# Patient Record
Sex: Female | Born: 1980 | Race: Asian | Hispanic: No | Marital: Married | Smoking: Never smoker
Health system: Southern US, Community
[De-identification: ages and names within clinical notes are randomized; demographics above are authoritative.]

## PROBLEM LIST (undated history)

## (undated) ENCOUNTER — Inpatient Hospital Stay: Payer: Self-pay

## (undated) DIAGNOSIS — Z789 Other specified health status: Secondary | ICD-10-CM

## (undated) HISTORY — PX: NO PAST SURGERIES: SHX2092

---

## 2017-02-15 NOTE — L&D Delivery Note (Signed)
Delivery Note Primary OB: [facility] Delivery Provider: Marcelyn BruinsJacelyn Fabricio Endsley, CNM Gestational Age: Full term Antepartum complications: intrauterine growth restriction and prolonged rupture of membranes Intrapartum complications: PROM  A viable female was delivered via vertex presentation, LOA. A loose nuchal cord was easily reduced. Delivery of the shoulders and body followed without difficulty. The infant was placed on the maternal abdomen. The umbilical cord was doubly clamped and cut following delayed cord clamping. Cord blood was collected. The placenta was delivered spontaneously and was inspected and found to be intact with a three vessel cord. The cervix and vagina were inspected. A second degree perineal laceration was repaired with 2-0 and 3-0 Vicryl. The fundus was firm with massage, and Cytotec was given PR for clots and bleeding. Following the administration of this medication, the fundus was firm and bleeding was within normal limits. Patient and infant were bonding in stable condition. All counts were correct.  Apgars:9, 9  Weight:  5 lb 14.5 oz.   Placenta status: spontaneous and intact.  Cord: 3 vessels;  with the following complications: nuchal.  Anesthesia:  local Episiotomy:  none Lacerations:  2nd Suture Repair: 2.0 Vicryl,  3.0 Vicryl Est. Blood Loss (mL):  1481  Mom to postpartum.  Baby to Couplet care / Skin to Skin.  Marcelyn BruinsJacelyn Jazara Swiney, CNM [facility], [facility] 09/13/2017  3:32 AM

## 2017-07-07 ENCOUNTER — Other Ambulatory Visit: Payer: Self-pay | Admitting: Physician Assistant

## 2017-07-07 DIAGNOSIS — Z3482 Encounter for supervision of other normal pregnancy, second trimester: Secondary | ICD-10-CM

## 2017-07-07 LAB — OB RESULTS CONSOLE HIV ANTIBODY (ROUTINE TESTING): HIV: NONREACTIVE

## 2017-07-08 LAB — OB RESULTS CONSOLE GC/CHLAMYDIA
CHLAMYDIA, DNA PROBE: NEGATIVE
GC PROBE AMP, GENITAL: NEGATIVE

## 2017-07-08 LAB — OB RESULTS CONSOLE HEPATITIS B SURFACE ANTIGEN: Hepatitis B Surface Ag: NEGATIVE

## 2017-07-08 LAB — OB RESULTS CONSOLE VARICELLA ZOSTER ANTIBODY, IGG: VARICELLA IGG: IMMUNE

## 2017-07-08 LAB — OB RESULTS CONSOLE RUBELLA ANTIBODY, IGM: RUBELLA: IMMUNE

## 2017-07-08 LAB — OB RESULTS CONSOLE RPR: RPR: NONREACTIVE

## 2017-07-12 ENCOUNTER — Ambulatory Visit
Admission: RE | Admit: 2017-07-12 | Discharge: 2017-07-12 | Disposition: A | Discharge status: Home / Self Care | Payer: Self-pay | Source: Ambulatory Visit | Attending: Physician Assistant | Admitting: Physician Assistant

## 2017-07-12 DIAGNOSIS — Z3482 Encounter for supervision of other normal pregnancy, second trimester: Secondary | ICD-10-CM | POA: Insufficient documentation

## 2017-07-25 ENCOUNTER — Other Ambulatory Visit: Payer: Self-pay | Admitting: Advanced Practice Midwife

## 2017-07-25 DIAGNOSIS — O0930 Supervision of pregnancy with insufficient antenatal care, unspecified trimester: Secondary | ICD-10-CM

## 2017-07-25 DIAGNOSIS — O09523 Supervision of elderly multigravida, third trimester: Secondary | ICD-10-CM

## 2017-08-08 ENCOUNTER — Other Ambulatory Visit: Payer: Self-pay | Admitting: *Deleted

## 2017-08-08 ENCOUNTER — Ambulatory Visit: Payer: Self-pay

## 2017-08-08 ENCOUNTER — Ambulatory Visit
Admission: RE | Admit: 2017-08-08 | Discharge: 2017-08-08 | Disposition: A | Discharge status: Home / Self Care | Payer: Self-pay | Source: Ambulatory Visit | Attending: Obstetrics & Gynecology | Admitting: Obstetrics & Gynecology

## 2017-08-08 DIAGNOSIS — O365930 Maternal care for other known or suspected poor fetal growth, third trimester, not applicable or unspecified: Secondary | ICD-10-CM | POA: Insufficient documentation

## 2017-08-08 DIAGNOSIS — O0930 Supervision of pregnancy with insufficient antenatal care, unspecified trimester: Secondary | ICD-10-CM

## 2017-08-08 DIAGNOSIS — O09523 Supervision of elderly multigravida, third trimester: Secondary | ICD-10-CM | POA: Insufficient documentation

## 2017-08-08 DIAGNOSIS — O0933 Supervision of pregnancy with insufficient antenatal care, third trimester: Secondary | ICD-10-CM | POA: Insufficient documentation

## 2017-08-11 ENCOUNTER — Other Ambulatory Visit: Payer: Self-pay | Admitting: *Deleted

## 2017-08-11 ENCOUNTER — Ambulatory Visit
Admission: RE | Admit: 2017-08-11 | Discharge: 2017-08-11 | Disposition: A | Discharge status: Home / Self Care | Payer: Self-pay | Source: Ambulatory Visit | Attending: Maternal & Fetal Medicine | Admitting: Maternal & Fetal Medicine

## 2017-08-11 DIAGNOSIS — O365930 Maternal care for other known or suspected poor fetal growth, third trimester, not applicable or unspecified: Secondary | ICD-10-CM

## 2017-08-15 ENCOUNTER — Ambulatory Visit
Admission: RE | Admit: 2017-08-15 | Discharge: 2017-08-15 | Disposition: A | Discharge status: Home / Self Care | Payer: Self-pay | Source: Ambulatory Visit | Attending: Obstetrics and Gynecology | Admitting: Obstetrics and Gynecology

## 2017-08-15 ENCOUNTER — Ambulatory Visit (HOSPITAL_BASED_OUTPATIENT_CLINIC_OR_DEPARTMENT_OTHER)
Admission: RE | Admit: 2017-08-15 | Discharge: 2017-08-15 | Disposition: A | Discharge status: Home / Self Care | Payer: Self-pay | Source: Ambulatory Visit | Attending: Obstetrics and Gynecology | Admitting: Obstetrics and Gynecology

## 2017-08-15 ENCOUNTER — Other Ambulatory Visit: Payer: Self-pay | Admitting: *Deleted

## 2017-08-15 ENCOUNTER — Other Ambulatory Visit: Payer: Self-pay

## 2017-08-15 DIAGNOSIS — O365930 Maternal care for other known or suspected poor fetal growth, third trimester, not applicable or unspecified: Secondary | ICD-10-CM | POA: Insufficient documentation

## 2017-08-15 DIAGNOSIS — Z3A34 34 weeks gestation of pregnancy: Secondary | ICD-10-CM | POA: Insufficient documentation

## 2017-08-15 DIAGNOSIS — O365990 Maternal care for other known or suspected poor fetal growth, unspecified trimester, not applicable or unspecified: Secondary | ICD-10-CM

## 2017-08-15 NOTE — Progress Notes (Signed)
NST for FGR at 5866w2d:  FHR baseline 120's Moderate variability 15x15 accels (2) No decels Rare contractions on toco  Reactive NST

## 2017-08-18 ENCOUNTER — Observation Stay
Admission: RE | Admit: 2017-08-18 | Discharge: 2017-08-18 | Disposition: A | Discharge status: Home / Self Care | Payer: Self-pay | Attending: Obstetrics & Gynecology | Admitting: Obstetrics & Gynecology

## 2017-08-18 ENCOUNTER — Other Ambulatory Visit: Payer: Self-pay

## 2017-08-18 DIAGNOSIS — Z3A34 34 weeks gestation of pregnancy: Secondary | ICD-10-CM | POA: Insufficient documentation

## 2017-08-18 DIAGNOSIS — O365990 Maternal care for other known or suspected poor fetal growth, unspecified trimester, not applicable or unspecified: Secondary | ICD-10-CM | POA: Diagnosis present

## 2017-08-18 DIAGNOSIS — O365930 Maternal care for other known or suspected poor fetal growth, third trimester, not applicable or unspecified: Secondary | ICD-10-CM

## 2017-08-18 HISTORY — DX: Other specified health status: Z78.9

## 2017-08-18 MED ORDER — ONDANSETRON HCL 4 MG/2ML IJ SOLN: 4.0000 mg | Freq: Four times a day (QID) | INTRAMUSCULAR | Status: DC | PRN

## 2017-08-18 MED ORDER — ACETAMINOPHEN 325 MG PO TABS: 650.0000 mg | ORAL_TABLET | ORAL | Status: DC | PRN

## 2017-08-18 NOTE — Final Progress Note (Signed)
Physician Final Progress Note  Patient ID: Cristobal GoldmannHien T Grudzien MRN: [id number] DOB/AGE: 35/02/1980 35 y.o.  Admit date: 08/18/2017 Admitting provider: Nadara Mustardobert P Keshana Klemz, MD Discharge date: 08/18/2017  Admission Diagnoses: 34 weeks, IUGR  Discharge Diagnoses:  Active Problems:   IUGR (intrauterine growth restriction) affecting care of mother  Consults: None  Significant Findings/ Diagnostic Studies: Antepartum Fetal Monitoring for IUGR Pt reports good FM, no s/sx PTL  Procedures: A NST procedure was performed with FHR monitoring and a normal baseline established, appropriate time of 20-40 minutes of evaluation, and accels >2 seen w 15x15 characteristics.  Results show a REACTIVE NST.   Discharge Condition: good  Disposition: Discharge disposition: 01-Home or Self Care       Diet: Regular diet  Discharge Activity: Activity as tolerated  Discharge Instructions    Call MD for:   Complete by:  As directed    Worsening contractions or pain; leakage of fluid; bleeding.   Diet general   Complete by:  As directed    Increase activity slowly   Complete by:  As directed      Allergies as of 08/18/2017   No Known Allergies     Medication List    TAKE these medications   prenatal multivitamin Tabs tablet Take 1 tablet by mouth daily at 12 noon.      Total time spent taking care of this patient: TRIAGE and NST Interpretation  Signed: Letitia LibraRobert Paul Nuha Degner 08/18/2017, 10:22 AM

## 2017-08-18 NOTE — OB Triage Note (Signed)
Pt is a G3P2 at 1929w5d that presents for a scheduled NST due to IUGR. Pt denies VB, LOF and states Positive FM. Pt has interpreter present.

## 2017-08-18 NOTE — Discharge Summary (Signed)
  See FPN  NST for IUGR  Annamarie MajorPaul Harris, MD, Merlinda FrederickFACOG [facility], [facility] 08/18/2017  10:22 AM

## 2017-08-22 ENCOUNTER — Other Ambulatory Visit: Payer: Self-pay

## 2017-08-22 ENCOUNTER — Ambulatory Visit
Admission: RE | Admit: 2017-08-22 | Discharge: 2017-08-22 | Disposition: A | Discharge status: Home / Self Care | Payer: Self-pay | Source: Ambulatory Visit | Attending: Maternal & Fetal Medicine | Admitting: Maternal & Fetal Medicine

## 2017-08-22 DIAGNOSIS — O365930 Maternal care for other known or suspected poor fetal growth, third trimester, not applicable or unspecified: Secondary | ICD-10-CM | POA: Insufficient documentation

## 2017-08-22 DIAGNOSIS — Z3A35 35 weeks gestation of pregnancy: Secondary | ICD-10-CM | POA: Insufficient documentation

## 2017-08-22 NOTE — ED Notes (Signed)
Kim the Interpreter is present with pt for visit.

## 2017-08-25 ENCOUNTER — Ambulatory Visit
Admission: RE | Admit: 2017-08-25 | Discharge: 2017-08-25 | Disposition: A | Discharge status: Home / Self Care | Payer: Self-pay | Source: Ambulatory Visit | Attending: Maternal & Fetal Medicine | Admitting: Maternal & Fetal Medicine

## 2017-08-25 DIAGNOSIS — O365930 Maternal care for other known or suspected poor fetal growth, third trimester, not applicable or unspecified: Secondary | ICD-10-CM

## 2017-08-25 NOTE — ED Notes (Signed)
Interpeteur Dominic PeaPhuong #[id number] used for office visit and NST today

## 2017-08-29 ENCOUNTER — Ambulatory Visit
Admission: RE | Admit: 2017-08-29 | Discharge: 2017-08-29 | Disposition: A | Discharge status: Home / Self Care | Payer: Self-pay | Source: Ambulatory Visit | Attending: Obstetrics & Gynecology | Admitting: Obstetrics & Gynecology

## 2017-08-29 DIAGNOSIS — Z362 Encounter for other antenatal screening follow-up: Secondary | ICD-10-CM | POA: Insufficient documentation

## 2017-08-29 DIAGNOSIS — O26843 Uterine size-date discrepancy, third trimester: Secondary | ICD-10-CM | POA: Insufficient documentation

## 2017-08-29 DIAGNOSIS — O365930 Maternal care for other known or suspected poor fetal growth, third trimester, not applicable or unspecified: Secondary | ICD-10-CM | POA: Insufficient documentation

## 2017-08-29 DIAGNOSIS — Z3A36 36 weeks gestation of pregnancy: Secondary | ICD-10-CM | POA: Insufficient documentation

## 2017-08-29 DIAGNOSIS — O09523 Supervision of elderly multigravida, third trimester: Secondary | ICD-10-CM | POA: Insufficient documentation

## 2017-09-01 ENCOUNTER — Other Ambulatory Visit: Payer: Self-pay | Admitting: *Deleted

## 2017-09-01 ENCOUNTER — Ambulatory Visit
Admission: RE | Admit: 2017-09-01 | Discharge: 2017-09-01 | Disposition: A | Discharge status: Home / Self Care | Payer: Self-pay | Source: Ambulatory Visit | Attending: Maternal & Fetal Medicine | Admitting: Maternal & Fetal Medicine

## 2017-09-01 DIAGNOSIS — O365930 Maternal care for other known or suspected poor fetal growth, third trimester, not applicable or unspecified: Secondary | ICD-10-CM

## 2017-09-05 ENCOUNTER — Ambulatory Visit
Admission: RE | Admit: 2017-09-05 | Discharge: 2017-09-05 | Disposition: A | Discharge status: Home / Self Care | Payer: Self-pay | Source: Ambulatory Visit | Attending: Maternal & Fetal Medicine | Admitting: Maternal & Fetal Medicine

## 2017-09-05 DIAGNOSIS — O365930 Maternal care for other known or suspected poor fetal growth, third trimester, not applicable or unspecified: Secondary | ICD-10-CM | POA: Insufficient documentation

## 2017-09-05 DIAGNOSIS — Z3A37 37 weeks gestation of pregnancy: Secondary | ICD-10-CM | POA: Insufficient documentation

## 2017-09-07 LAB — OB RESULTS CONSOLE GBS: GBS: NEGATIVE

## 2017-09-08 ENCOUNTER — Ambulatory Visit
Admission: RE | Admit: 2017-09-08 | Discharge: 2017-09-08 | Disposition: A | Discharge status: Home / Self Care | Payer: Self-pay | Source: Ambulatory Visit | Attending: Obstetrics and Gynecology | Admitting: Obstetrics and Gynecology

## 2017-09-08 ENCOUNTER — Other Ambulatory Visit: Payer: Self-pay | Admitting: *Deleted

## 2017-09-08 DIAGNOSIS — O365931 Maternal care for other known or suspected poor fetal growth, third trimester, fetus 1: Secondary | ICD-10-CM

## 2017-09-08 DIAGNOSIS — O365930 Maternal care for other known or suspected poor fetal growth, third trimester, not applicable or unspecified: Secondary | ICD-10-CM

## 2017-09-08 NOTE — Progress Notes (Signed)
NST reactive   Baseline 120s  Moderate variability  Pos accels , no decels  With irregular UCs -see nursing note Via interpreter  I reviewed the findings of FGR <3rd percentile on last scan  I reviewed that this places the fetus at higher risk of stillbirth and so we recommend induction of labor at 37 -39 weeks.  Pt is concerned about a cesarean with induction - I reviewed the induction process and our goal of another vaginal delivery . Pt very much prefers expectant management and will await spontaneous labor  I told her that we will do nothing without her permission and given NST is reactive I did not insist she go to L&D  I told her if she has any concerns she can go to l&D directly- reviewed specifically decreased fetal movement , bleeding , ruptured membranes or regular UCs  .  We can discuss induction further when she returns on Monday   Jimmey RalphLivingston, Kim Hoffman

## 2017-09-12 ENCOUNTER — Other Ambulatory Visit: Payer: Self-pay

## 2017-09-12 ENCOUNTER — Inpatient Hospital Stay
Admission: RE | Admit: 2017-09-12 | Discharge: 2017-09-12 | Disposition: A | Discharge status: Home / Self Care | Payer: Self-pay | Source: Ambulatory Visit

## 2017-09-12 ENCOUNTER — Inpatient Hospital Stay
Admission: EM | Admit: 2017-09-12 | Discharge: 2017-09-14 | DRG: 806 | Disposition: A | Discharge status: Home / Self Care | Payer: Self-pay | Attending: Obstetrics & Gynecology | Admitting: Obstetrics & Gynecology

## 2017-09-12 DIAGNOSIS — O9081 Anemia of the puerperium: Secondary | ICD-10-CM | POA: Diagnosis not present

## 2017-09-12 DIAGNOSIS — O4202 Full-term premature rupture of membranes, onset of labor within 24 hours of rupture: Secondary | ICD-10-CM

## 2017-09-12 DIAGNOSIS — O365930 Maternal care for other known or suspected poor fetal growth, third trimester, not applicable or unspecified: Secondary | ICD-10-CM

## 2017-09-12 DIAGNOSIS — O4292 Full-term premature rupture of membranes, unspecified as to length of time between rupture and onset of labor: Principal | ICD-10-CM | POA: Diagnosis present

## 2017-09-12 DIAGNOSIS — O4290 Premature rupture of membranes, unspecified as to length of time between rupture and onset of labor, unspecified weeks of gestation: Secondary | ICD-10-CM | POA: Diagnosis present

## 2017-09-12 DIAGNOSIS — D62 Acute posthemorrhagic anemia: Secondary | ICD-10-CM | POA: Diagnosis not present

## 2017-09-12 DIAGNOSIS — Z3A38 38 weeks gestation of pregnancy: Secondary | ICD-10-CM

## 2017-09-12 LAB — TYPE AND SCREEN
ABO/RH(D): O POS
Antibody Screen: NEGATIVE

## 2017-09-12 LAB — CBC
HEMATOCRIT: 41.3 % (ref 35.0–47.0)
Hemoglobin: 13.6 g/dL (ref 12.0–16.0)
MCH: 31.2 pg (ref 26.0–34.0)
MCHC: 33 g/dL (ref 32.0–36.0)
MCV: 94.7 fL (ref 80.0–100.0)
Platelets: 212 10*3/uL (ref 150–440)
RBC: 4.36 MIL/uL (ref 3.80–5.20)
RDW: 16 % — ABNORMAL HIGH (ref 11.5–14.5)
WBC: 9.3 10*3/uL (ref 3.6–11.0)

## 2017-09-12 MED ORDER — TERBUTALINE SULFATE 1 MG/ML IJ SOLN: 0.2500 mg | Freq: Once | INTRAMUSCULAR | Status: DC | PRN

## 2017-09-12 MED ORDER — LACTATED RINGERS IV SOLN: 500.0000 mL | INTRAVENOUS | Status: DC | PRN

## 2017-09-12 MED ORDER — SODIUM CHLORIDE 0.9 % IV SOLN
INTRAVENOUS | Status: AC
  Filled 2017-09-12: qty 2000

## 2017-09-12 MED ORDER — OXYTOCIN 10 UNIT/ML IJ SOLN
INTRAMUSCULAR | Status: AC
  Filled 2017-09-12: qty 2

## 2017-09-12 MED ORDER — FENTANYL CITRATE (PF) 100 MCG/2ML IJ SOLN
50.0000 ug | INTRAMUSCULAR | Status: DC | PRN
  Administered 2017-09-13 (×2): 100 ug via INTRAVENOUS
  Filled 2017-09-12 (×2): qty 2

## 2017-09-12 MED ORDER — OXYTOCIN 40 UNITS IN LACTATED RINGERS INFUSION - SIMPLE MED: 1.0000 m[IU]/min | INTRAVENOUS | Status: DC

## 2017-09-12 MED ORDER — ACETAMINOPHEN 325 MG PO TABS: 650.0000 mg | ORAL_TABLET | ORAL | Status: DC | PRN

## 2017-09-12 MED ORDER — LACTATED RINGERS IV SOLN
INTRAVENOUS | Status: DC
  Administered 2017-09-12 (×2): via INTRAVENOUS

## 2017-09-12 MED ORDER — ONDANSETRON HCL 4 MG/2ML IJ SOLN: 4.0000 mg | Freq: Four times a day (QID) | INTRAMUSCULAR | Status: DC | PRN

## 2017-09-12 MED ORDER — LIDOCAINE HCL (PF) 1 % IJ SOLN
INTRAMUSCULAR | Status: AC
  Filled 2017-09-12: qty 30

## 2017-09-12 MED ORDER — OXYTOCIN 40 UNITS IN LACTATED RINGERS INFUSION - SIMPLE MED
1.0000 m[IU]/min | INTRAVENOUS | Status: DC
  Administered 2017-09-12: 1 m[IU]/min via INTRAVENOUS
  Filled 2017-09-12: qty 1000

## 2017-09-12 MED ORDER — OXYTOCIN 40 UNITS IN LACTATED RINGERS INFUSION - SIMPLE MED
2.5000 [IU]/h | INTRAVENOUS | Status: DC
  Administered 2017-09-13 (×2): 2.5 [IU]/h via INTRAVENOUS
  Filled 2017-09-12 (×2): qty 1000

## 2017-09-12 MED ORDER — OXYTOCIN BOLUS FROM INFUSION
500.0000 mL | Freq: Once | INTRAVENOUS | Status: AC
  Administered 2017-09-13: 500 mL via INTRAVENOUS

## 2017-09-12 MED ORDER — MISOPROSTOL 200 MCG PO TABS
ORAL_TABLET | ORAL | Status: AC
  Administered 2017-09-13: 800 ug
  Filled 2017-09-12: qty 4

## 2017-09-12 MED ORDER — SODIUM CHLORIDE 0.9 % IV SOLN
1.0000 g | INTRAVENOUS | Status: DC
  Administered 2017-09-12: 1 g via INTRAVENOUS
  Filled 2017-09-12 (×3): qty 1000

## 2017-09-12 MED ORDER — SODIUM CHLORIDE 0.9 % IV SOLN
2.0000 g | Freq: Once | INTRAVENOUS | Status: AC
  Administered 2017-09-12: 2 g via INTRAVENOUS

## 2017-09-12 MED ORDER — AMMONIA AROMATIC IN INHA
RESPIRATORY_TRACT | Status: AC
  Filled 2017-09-12: qty 10

## 2017-09-12 NOTE — OB Triage Note (Signed)
Pt states she noticed leaking in a small trickle of watery discharge after using bathroom, and when she changes position.

## 2017-09-12 NOTE — Progress Notes (Signed)
  Labor Progress Note   37 y.o. [id number] @ 2276w2d , admitted for  Pregnancy, Labor Management. PROM, IOL.  Subjective:  Calm, coping well with contractions, rates pain 6/10. Does not desire any pain medication at this time.  Objective:  BP 116/65   Pulse 79   Temp 98 F (36.7 C)   Resp 16   Ht 5\' 1"  (1.549 m)   Wt 144 lb (65.3 kg)   LMP 12/18/2016   SpO2 99%   BMI 27.21 kg/m  Abd: mild to moderate with contractions Extr: trace to 1+ bilateral pedal edema SVE: 3/60/-2  EFM: FHR: 120 bpm, variability: moderate,  accelerations:  Present,  decelerations:  Present occasional variable Toco: Frequency: Every 3-4 minutes, Duration: 60-80 seconds and Intensity: mild Labs: I have reviewed the patient's lab results.   Assessment & Plan:  [id number] @ 4976w2d, admitted for  Pregnancy and Labor/Delivery Management  1. Pain management: none, confirmed with [place] interpreter that this is still her choice for now. She will notify us if she desires any medication or an epidural. 2. FWB: reassuring, occasional variable with good return to baseline.  3. ID: GBS negative, starting ampicillin now for prolonged ROM, patient aware. 4. Labor management: Continue Pitocin induction. Consider IUPC with next exam.  All discussed with patient with assistance of [place] interpreter; see orders.  Marcelyn BruinsJacelyn Adrien Shankar, CNM 09/12/2017  6:06 PM

## 2017-09-12 NOTE — H&P (Signed)
Obstetrics Admission History & Physical     HPI:  37 y.o. [id number] @ 3841w2d (09/24/2017, by Last Menstrual Period). Admitted on 09/12/2017:   Patient Active Problem List   Diagnosis Date Noted  . PROM (premature rupture of membranes) 09/12/2017  . IUGR (intrauterine growth restriction) affecting care of mother 08/18/2017  . Poor fetal growth affecting management of mother 08/15/2017     Presents for possible fluid leaking at home beginning yesterday at around 1700. No vaginal bleeding. She is not feeling any contractions. Good fetal movement.  Positive on exam for PROM.   Prenatal care at: at [facility]. Pregnancy complicated by IUGR.  ROS: A review of systems was performed and negative, except as stated in the above HPI.  PMHx:  Past Medical History:  Diagnosis Date  . Medical history non-contributory    PSHx:  Past Surgical History:  Procedure Laterality Date  . NO PAST SURGERIES     Medications:  Medications Prior to Admission  Medication Sig Dispense Refill Last Dose  . Prenatal Vit-Fe Fumarate-FA (PRENATAL MULTIVITAMIN) TABS tablet Take 1 tablet by mouth daily at 12 noon.   09/11/2017 at Unknown time   Allergies: has No Known Allergies. OBHx:  OB History  Gravida Para Term Preterm AB Living  3 2 2     2   SAB TAB Ectopic Multiple Live Births          2    # Outcome Date GA Lbr Len/2nd Weight Sex Delivery Anes PTL Lv  3 Current           2 Term 08/16/12    F Vag-Spont None  LIV  1 Term 10/10/10    M Vag-Spont None  LIV   AVW:UJWJXBJY/[id number]:Negative/unremarkable except as detailed in HPI.Marland Kitchen.  No family history of birth defects. Soc Hx: Never smoker, Alcohol: none, Recreational drug use: none and Denies domestic abuse  Objective:   Vitals:   09/12/17 1017  BP: 113/65  Pulse: 90  Resp: 16  Temp: 98.1 F (36.7 C)  SpO2: 99%   Constitutional: Well nourished, well developed female in no acute distress.  HEENT: normal Skin: Warm and dry.  Cardiovascular: Regular rate and rhythm.    Extremity: trace to 1+ bilateral pedal edema Respiratory: Clear to auscultation bilaterally. Normal respiratory effort Abdomen: gravid, non-tender Neuro: DTRs 2+, Cranial nerves grossly intact Psych: Alert and Oriented x3. No memory deficits. Normal mood and affect.  MS: normal gait, normal bilateral lower extremity ROM/strength/stability.  Pelvic exam: is not limited by body habitus External Genitalia, Bartholin's glands, Urethra, Skene's glands: within normal limits Vagina: within normal limits and with no blood in the vault. Cervix: 2 cm/50/-3 Sterile speculum exam showed small amount of fluid present, positive ferning on microscopy Uterus: Uterus demonstrates irritability pattern.  Adnexa: not evaluated  EFM: FHR:130 bpm, variability: moderate, accelerations: Present,  decelerations: Absent Toco: Occasional irritability   Perinatal info:  Blood type: O positive Rubella - Immune Varicella - Immune TDaP -  Given in [place] during this pregnancy (2019) RPR NR / HIV Neg/ HBsAg Neg   Assessment & Plan:   37 y.o. [id number] @ 6941w2d, Admitted on 09/12/2017: for Induction of labor due to PROM; IUGR.  Admit for labor, Observe for cervical change, Fetal Wellbeing Reassuring, Epidural when ready and GBS status negative, treat as needed. Start Pitocin induction.  Marcelyn BruinsJacelyn Deshia Vanderhoof, CNM [facility], [facility] 09/12/2017  11:18 AM

## 2017-09-12 NOTE — Progress Notes (Signed)
AD request. Staff report patient may note understand AD due to cultural and language differences. Unit Staff agreed that once friends return to hospital staff will connect with chaplains to assist with AD needs.

## 2017-09-12 NOTE — Progress Notes (Signed)
1944 - pt sitting upright in bed, talking with family via phone. Monitors changed to wireless to improve ease of pt movement and ambulating when needed.

## 2017-09-12 NOTE — OB Triage Note (Signed)
Pt presents c/o possible LOF since yesterday. Nitrazine negative. Denies any ctx, bleeding, or pain. Reports positive fetal movement. Vitals WNL. Will continue to monitor.

## 2017-09-13 ENCOUNTER — Encounter: Payer: Self-pay | Admitting: *Deleted

## 2017-09-13 DIAGNOSIS — Z3A38 38 weeks gestation of pregnancy: Secondary | ICD-10-CM

## 2017-09-13 DIAGNOSIS — O365930 Maternal care for other known or suspected poor fetal growth, third trimester, not applicable or unspecified: Secondary | ICD-10-CM

## 2017-09-13 DIAGNOSIS — O4202 Full-term premature rupture of membranes, onset of labor within 24 hours of rupture: Secondary | ICD-10-CM

## 2017-09-13 LAB — CBC
HCT: 30 % — ABNORMAL LOW (ref 35.0–47.0)
Hemoglobin: 10.3 g/dL — ABNORMAL LOW (ref 12.0–16.0)
MCH: 31 pg (ref 26.0–34.0)
MCHC: 34.2 g/dL (ref 32.0–36.0)
MCV: 90.5 fL (ref 80.0–100.0)
PLATELETS: 198 10*3/uL (ref 150–440)
RBC: 3.32 MIL/uL — ABNORMAL LOW (ref 3.80–5.20)
RDW: 15.3 % — AB (ref 11.5–14.5)
WBC: 17.6 10*3/uL — ABNORMAL HIGH (ref 3.6–11.0)

## 2017-09-13 LAB — RPR: RPR Ser Ql: NONREACTIVE

## 2017-09-13 MED ORDER — OXYCODONE-ACETAMINOPHEN 5-325 MG PO TABS: 2.0000 | ORAL_TABLET | ORAL | Status: DC | PRN

## 2017-09-13 MED ORDER — IBUPROFEN 600 MG PO TABS
600.0000 mg | ORAL_TABLET | Freq: Four times a day (QID) | ORAL | Status: DC
  Administered 2017-09-13: 600 mg via ORAL

## 2017-09-13 MED ORDER — WITCH HAZEL-GLYCERIN EX PADS: 1.0000 "application " | MEDICATED_PAD | CUTANEOUS | Status: DC | PRN

## 2017-09-13 MED ORDER — ONDANSETRON HCL 4 MG/2ML IJ SOLN: 4.0000 mg | INTRAMUSCULAR | Status: DC | PRN

## 2017-09-13 MED ORDER — DIPHENHYDRAMINE HCL 25 MG PO CAPS: 25.0000 mg | ORAL_CAPSULE | Freq: Four times a day (QID) | ORAL | Status: DC | PRN

## 2017-09-13 MED ORDER — BENZOCAINE-MENTHOL 20-0.5 % EX AERO: 1.0000 "application " | INHALATION_SPRAY | CUTANEOUS | Status: DC | PRN

## 2017-09-13 MED ORDER — LIDOCAINE HCL (PF) 1 % IJ SOLN
30.0000 mL | Freq: Once | INTRAMUSCULAR | Status: AC
  Administered 2017-09-13: 30 mL

## 2017-09-13 MED ORDER — IBUPROFEN 600 MG PO TABS
600.0000 mg | ORAL_TABLET | Freq: Four times a day (QID) | ORAL | Status: DC
  Administered 2017-09-13 – 2017-09-14 (×5): 600 mg via ORAL
  Filled 2017-09-13 (×5): qty 1

## 2017-09-13 MED ORDER — ONDANSETRON HCL 4 MG PO TABS: 4.0000 mg | ORAL_TABLET | ORAL | Status: DC | PRN

## 2017-09-13 MED ORDER — SIMETHICONE 80 MG PO CHEW: 80.0000 mg | CHEWABLE_TABLET | ORAL | Status: DC | PRN

## 2017-09-13 MED ORDER — COCONUT OIL OIL
1.0000 "application " | TOPICAL_OIL | Status: DC | PRN
  Administered 2017-09-14: 1 via TOPICAL
  Filled 2017-09-13: qty 120

## 2017-09-13 MED ORDER — PRENATAL MULTIVITAMIN CH
1.0000 | ORAL_TABLET | Freq: Every day | ORAL | Status: DC
  Administered 2017-09-13 – 2017-09-14 (×2): 1 via ORAL
  Filled 2017-09-13 (×2): qty 1

## 2017-09-13 MED ORDER — SENNOSIDES-DOCUSATE SODIUM 8.6-50 MG PO TABS
2.0000 | ORAL_TABLET | ORAL | Status: DC
  Administered 2017-09-14: 2 via ORAL
  Filled 2017-09-13: qty 2

## 2017-09-13 MED ORDER — ACETAMINOPHEN 325 MG PO TABS: 650.0000 mg | ORAL_TABLET | ORAL | Status: DC | PRN

## 2017-09-13 MED ORDER — IBUPROFEN 600 MG PO TABS
ORAL_TABLET | ORAL | Status: AC
  Filled 2017-09-13: qty 1

## 2017-09-13 MED ORDER — DIBUCAINE 1 % RE OINT: 1.0000 "application " | TOPICAL_OINTMENT | RECTAL | Status: DC | PRN

## 2017-09-13 MED ORDER — OXYCODONE-ACETAMINOPHEN 5-325 MG PO TABS: 1.0000 | ORAL_TABLET | ORAL | Status: DC | PRN

## 2017-09-13 NOTE — Lactation Note (Addendum)
This note was copied from a baby's chart. Lactation Consultation Note  Patient Name: Kim Benjaman PottHien Funchess AOZHY'QToday's Date: 09/13/2017 Reason for consult: Follow-up assessment   Maternal Data    Feeding Feeding Type: Bottle Fed - Formula Nipple Type: Regular  LATCH Score Latch: Too sleepy or reluctant, no latch achieved, no sucking elicited.  Audible Swallowing: None  Type of Nipple: Everted at rest and after stimulation  Comfort (Breast/Nipple): Soft / non-tender  Hold (Positioning): Assistance needed to correctly position infant at breast and maintain latch.  LATCH Score: 5  Interventions Interventions: DEBP  Lactation Tools Discussed/Used     Consult Status Consult Status: Follow-up Date: 09/13/17 Follow-up type: In-patient  LC (myself along with Rosie FateSandra Shields [facility]) assisted mother with latch and positioning of infant. LCs attempted multiple times to latch infant and each time infant was very sleepy and spitting up clear mucous on some occasions. [employer] mobile interpreter used to communicate with patient. LC discussed pumping with mother and demonstrated the use of the pump. Mother pumped for 15 minutes and did not pump out any colostrum. Hand expression yielded a few drops of colostrum which was fed to infant. Infant became very fussy and showing hunger cues so LC discussed with mother the option of feeding infant donor milk or formula until infant is more alert to breastfeed. Mother stated that she would like to feed infant formula. Nurse encouraged mother to continue pumping to stimulate colostrum production.   [name] 09/13/2017, 4:50 PM

## 2017-09-13 NOTE — Plan of Care (Signed)
PPH with QBL of 1481 at delivery. Cytotec and second bag of pit given. Difficulty voiding. May have to In and out cath. 2nd degree tear with repair.

## 2017-09-13 NOTE — Discharge Summary (Signed)
OB Discharge Summary     Patient Name: Kim GoldmannHien T Boyte DOB: 02/05/1981 MRN: [id number]  Date of admission: 09/12/2017 Delivering Provider: Oswaldo ConroyJacelyn Y Schmid, CNM  Date of Delivery: 09/13/2017  Date of discharge: 09/14/2017 Admitting diagnosis: PROM Intrauterine pregnancy: 3758w3d     Secondary diagnosis: IUGR     Discharge diagnosis: Term Pregnancy Delivered, Post partum hemorrhage . Inadequate labor, Prolonged rupture of membranes, acute blood loss anemia                        Hospital course:  Induction of Labor With Vaginal Delivery   37 y.o. yo G3P2002 at 5658w3d was admitted to the hospital 09/12/2017 for induction of labor.  Indication for induction: PROM and IUGR.  Patient had an uncomplicated labor course as follows: Membrane Rupture Time/Date:   ,09/11/2017   Intrapartum Procedures: Episiotomy: None [1]                                         Lacerations:  2nd degree [3]  Patient had delivery of a Viable infant.  Information for the patient's newborn:  Abelina Bachelorham, Girl Francena [[id number]][[id number]]  Delivery Method: Vag-Spont   09/13/2017  Details of delivery can be found in separate delivery note.  Patient had a postpartum hemorrhage with >1000 ml blood loss. Hemoglobin 9.9gm/dl on PPD #1. She was hemodynamically stable, tolerating a regular diet, ambulating without assist, and voiding without difficulty. Her lochia was appropriate. Patient is discharged home in stable condition on  09/14/2017.                                                             Post partum procedures:none  Complications: Hemorrhage>102800mL and ROM>24 hours  Physical exam on 09/14/2017: Vitals:   09/13/17 1640 09/13/17 2048 09/13/17 2328 09/14/17 0805  BP: 104/61 103/69 (!) 91/55 98/61  Pulse: 76 80 75 64  Resp: 18 18 20 18   Temp: 98.3 F (36.8 C) 98.4 F (36.9 C) 98.2 F (36.8 C) 97.9 F (36.6 C)  TempSrc: Oral Oral Oral Oral  SpO2: 99% 99% 97% 98%  Weight:      Height:       General: alert, cooperative and no  distress Lochia: appropriate Uterine Fundus: firm/ U-3/ ML/ NT Incision: intact, mild bruising DVT Evaluation: No evidence of DVT seen on physical exam.  Labs: Lab Results  Component Value Date   WBC 13.3 (H) 09/14/2017   HGB 9.9 (L) 09/14/2017   HCT 29.4 (L) 09/14/2017   MCV 90.2 09/14/2017   PLT 207 09/14/2017   No flowsheet data found.  Discharge instruction: per After Visit Summary.  Medications:  Allergies as of 09/14/2017   No Known Allergies     Medication List    TAKE these medications   ferrous sulfate 325 (65 FE) MG tablet Take 1 tablet (325 mg total) by mouth daily with breakfast.   ibuprofen 600 MG tablet Commonly known as:  ADVIL,MOTRIN Take 1 tablet (600 mg total) by mouth every 6 (six) hours as needed.   prenatal multivitamin Tabs tablet Take 1 tablet by mouth daily at 12 noon.       Diet: routine diet  Activity:  Advance as tolerated. Pelvic rest for 6 weeks.   Outpatient follow up: Follow-up Information    Department, [facility]. Schedule an appointment as soon as possible for a visit in 6 week(s).   Contact information: [address] [place] [address] [phone]             Postpartum contraception: IUD -paragard vs Liletta/ Mirena Rhogam Given postpartum: no Rubella vaccine given postpartum: no Varicella vaccine given postpartum: no TDaP given antepartum or postpartum: Yes  Newborn Data: Live born female / [place] [facility] Birth Weight: 5 lb 14.5 oz (2680 g) APGAR: 9, 9  Newborn Delivery   Birth date/time:  09/13/2017 02:03:00 Delivery type:  Vaginal, Spontaneous      Baby Feeding: Breast  Disposition:home with mother   Farrel Conners, CNM 09/14/2017 11:21 AM

## 2017-09-13 NOTE — Progress Notes (Signed)
[place] interpreter accessed to communicate care concerns with pt. Discussed pain and discomfort, pt says she hurts in the incision area mostly with moving and position changes, rates 5-6/10, otherwise says she is not having any pain. Encouraged pt to take po Motrin. Explained to pt concerns with having minimal urine output since delivery given the amount of blood loss during her delivery. Recent fundal massage with uterus displaced and continued trickling blood. Risk for increased pain and heavier blood loss with clots d/t overdistended bladder explained to pt via interpreter. Pt agreed to allow staff to perform in/out cath.  In/out cath performed at 0545, drained 600ml yellow urine.

## 2017-09-14 ENCOUNTER — Encounter: Payer: Self-pay | Admitting: Certified Nurse Midwife

## 2017-09-14 DIAGNOSIS — D62 Acute posthemorrhagic anemia: Secondary | ICD-10-CM | POA: Diagnosis not present

## 2017-09-14 LAB — CBC
HCT: 29.4 % — ABNORMAL LOW (ref 35.0–47.0)
Hemoglobin: 9.9 g/dL — ABNORMAL LOW (ref 12.0–16.0)
MCH: 30.4 pg (ref 26.0–34.0)
MCHC: 33.7 g/dL (ref 32.0–36.0)
MCV: 90.2 fL (ref 80.0–100.0)
PLATELETS: 207 10*3/uL (ref 150–440)
RBC: 3.26 MIL/uL — AB (ref 3.80–5.20)
RDW: 15.7 % — ABNORMAL HIGH (ref 11.5–14.5)
WBC: 13.3 10*3/uL — AB (ref 3.6–11.0)

## 2017-09-14 MED ORDER — IBUPROFEN 600 MG PO TABS: 600.0000 mg | ORAL_TABLET | Freq: Four times a day (QID) | ORAL | 0 refills | Status: AC | PRN

## 2017-09-14 MED ORDER — FERROUS SULFATE 325 (65 FE) MG PO TABS: 325.0000 mg | ORAL_TABLET | Freq: Every day | ORAL | 1 refills | Status: AC

## 2017-09-14 NOTE — Progress Notes (Addendum)
Post Partum Day 1 Subjective: Via a [place] interpretor: eating well, voiding without difficulty, bleeding slowing. Mild lightheadedness. Pumped a couple of times-did not get any breast milk, so is supplementing with formula.  Objective: Blood pressure 98/61, pulse 64, temperature 97.9 F (36.6 C), temperature source Oral, resp. rate 18, height 5\' 1"  (1.549 m), weight 65.3 kg (144 lb), last menstrual period 12/18/2016, SpO2 98 %, unknown if currently breastfeeding.  Physical Exam:  General: alert, cooperative and no distress Lochia: appropriate Uterine Fundus: firm/ U-3/ML/ NT Perineum: intact and clean DVT Evaluation: No evidence of DVT seen on physical exam.  Recent Labs    09/12/17 1120 09/13/17 0955  HGB 13.6 10.3*  HCT 41.3 30.0*  WBC 9.3 17.6*  PLT 212 198    Assessment/Plan: Stable PPD #1 Patient desires discharge home today Mild anemia from acute blood loss-hemodynamically stable but with some lightheadedness (? Nurse got a negative response and CNM got a positive response)  Repeat CBC this Am O POS/ RI/ VI Breast/bottle IUD for contraception (Mirena vs Paragard) TDAP UTD   LOS: 2 days   Farrel ConnersColleen Angeles Paolucci 09/14/2017, 9:29 AM

## 2017-09-14 NOTE — Lactation Note (Signed)
This note was copied from a baby's chart. Lactation Consultation Note  Patient Name: Kim Hoffman ZOXWR'UToday's Date: 09/14/2017 Reason for consult: Follow-up assessment   Maternal Data    Feeding Feeding Type: Formula Nipple Type: Slow - flow Length of feed: 10 min  LATCH Score                   Interventions Interventions: DEBP  Lactation Tools Discussed/Used     Consult Status Consult Status: Follow-up Date: 09/14/17 Follow-up type: In-patient LC followed up with mother via [employer] mobile interpreter to ask about her plan for breastfeeding and/or pumping. Mother states that infant has been given mostly formula. Mother states that she tried about 3 times during the night to breastfeed infant and fed formula afterwards. Mother states that infant has been spitting up. LC explained to mother that infant may be getting too much formula per feeding. LC also discussed with mother infant stomach size and watching for hunger cues and encouraged mother to start with 15 mL and watch infant afterwards for cues. Mother is still interested in breastfeeding. LC explained that she will have to increase the frequency of breastfeeding and/or pumping to encourage her milk supply starting with offering infant the breast first and then formula if infant is still cueing.  Infant is asleep after the last feeding so LC assisted mother with setup of the pump and instructed her to pump every 2 hours. Mother verbalized understanding of infant feeding patterns, hunger cues and instructions on DEBP and frequency of pumping and denies questions at this time.  Kim Hoffman 09/14/2017, 11:43 AM

## 2017-09-14 NOTE — Discharge Instructions (Signed)
Thi?u mu Anemia Thi?u mu l tnh tr?ng trong ? qu v? khng c ?? h?ng c?u ho?c hemoglobin. Hemoglobin l m?t ch?t trong h?ng c?u mang  xi. Khi qu v? khng c ?? h?ng c?u ho?c hemoglobin (thi?u mu), c? th? qu v? khng th? nh?n ??  xi v cc c? quan trong c? th? c th? khng ho?t ??ng chnh xc. K?t qu? l, qu v? c th? c?m th?y r?t m?t m?i ho?c g?p nh?ng v?n ?? khc. Nguyn nhn g gy ra? Nh?ng nguyn nhn ph? bi?n gy thi?u mu bao g?m:  Ch?y mu qu nhi?u. Thi?u mu c th? do ch?y mu qu nhi?u bn trong ho?c bn ngoi c? th?, g?m c? ch?y mu t? ru?t ho?c trong k? kinh nguy?t ? ph? n?.  Dinh d??ng km.  B?nh th?n, tuy?n gip v gan ko di (m?n tnh).  R?i lo?n t?y x??ng.  Ung th? v ?i?u tr? ung th?.  HIV (vi rt gy suy gi?m mi?n d?ch ? ng??i) v AIDS (h?i ch?ng suy gi?m mi?n d?ch m?c ph?i).  ?i?u tr? HIV v AIDS.  Cc v?n ?? ? lch.  Cc b?nh mu.  Nhi?m trng, thu?c men v cc b?nh t? mi?n d?ch lm ph h?y h?ng c?u.  Cc d?u hi?u ho?c tri?u ch?ng l g? Nh?ng tri?u ch?ng c?a tnh tr?ng ny bao g?m:  H?i y?u.  Chng m?t.  ?au ??u.  C?m th?y nh?p ??p c?a tim khng ??u ho?c nhanh h?n bnh th??ng (?nh tr?ng ng?c).  Kh th?, ??c bi?t l khi t?p th? d?c.  Nh?t nh?t.  Nh?y c?m l?nh.  Kh tiu.  Bu?n nn.  Kh ng?.  Kh t?p trung.  Cc tri?u ch?ng c th? x?y ra ??t ng?t ho?c pht tri?n t? t?. N?u tnh tr?ng thi?u mu c?a qu v? l nh?, qu v? c th? khng c tri?u ch?ng. Ch?n ?on tnh tr?ng ny nh? th? no? Tnh tr?ng ny ???c ch?n ?on d?a vo:  Xt nghi?m mu.  B?nh s? c?a qu v?.  Khm th?c th?.  Sinh thi?t t?y x??ng.  Chuyn gia ch?m sc s?c kh?e c?a qu v? c?ng c th? ki?m tra phn (phn) ?? tm mu v c th? lm thm xt nghi?m ?? tm nguyn nhn ch?y mu ? qu v?. Qu v? c?ng c th? ph?i lm cc ki?m tra khc, bao g?m:  Ki?m tra hnh ?nh, ch?ng h?n nh? ch?p CT ho?c MRI.  N?i soi.  N?i soi ??i trng.  Tnh tr?ng ny ???c ?i?u tr?  nh? th? no? Vi?c ?i?u tr? tnh tr?ng ny ty thu?c vo nguyn nhn gy b?nh. N?u qu v? ti?p t?c m?t nhi?u mu, qu v? c th? c?n ???c ?i?u tr? t?i b?nh vi?n. ?i?u tr? c th? bao g?m:  B? sung s?t, vitamin B12 ho?c axit folic.  Dng thu?c hc mn (erythropoietin) c th? gip kch thch t?ng tr??ng h?ng c?u.  Truy?n mu. C th? c?n truy?n mu n?u qu v? m?t nhi?u mu.  Thay ??i ch? ?? ?n.  Ph?u thu?t ?? c?t b? lch.  Tun th? nh?ng h??ng d?n ny ? nh:  Ch? s? d?ng thu?c khng k ??n v thu?c k ??n theo ch? d?n c?a chuyn gia ch?m sc s?c kh?e.  Ch? dng th?c ph?m b? sung theo chi? d?n c?a chuyn gia ch?m sc s?c kh?e.  Tun th? b?t c? h??ng d?n no v? ch? ?? ?n ? cung c?p cho qu v?.  Tun th? t?t c? cc l?n khm theo   di theo ch? d?n c?a chuyn gia ch?m sc s?c kh?e. ?i?u ny c vai tr quan tr?ng. Hy lin l?c v?i chuyn gia ch?m sc s?c kh?e n?u:  Qu v? m?i b? ch?y mu ? b?t c? n?i ?u trn c? th?. Yu c?u tr? gip ngay l?p t?c n?u:  Qu v? r?t y?u.  Quy? vi? bi? kh th?.  Qu v? b? ?au ? ng?c ho?c ? b?ng.  Qu v? b? chng m?t ho?c c?m th?y ng?t x?u.  Qu v? kh t?p trung.  Phn c?a qu v? c mu ho?c c mu ?en nh? h?c n.  Qu v? nn lin t?c ho?c nn ra mu. Tm t?t  Thi?u mu l tnh tr?ng trong ? qu v? khng c ?? h?ng c?u ho?c khng c ?? m?t ch?t trong h?ng c?u mang  xi (hemoglobin).  Cc tri?u ch?ng c th? x?y ra ??t ng?t ho?c pht tri?n t? t?.  N?u tnh tr?ng thi?u mu c?a qu v? l nh?, qu v? c th? khng c tri?u ch?ng.  Tnh tr?ng ny ???c ch?n ?on d?a vo xt nghi?m mu c?ng nh? khai thc b?nh s? v khm th?c th?. C th? c?n cc xt nghi?m khc.  Vi?c ?i?u tr? tnh tr?ng ny ty thu?c vo nguyn gy thi?u mu. Thng tin ny khng nh?m m?c ?ch thay th? cho l?i khuyn m chuyn gia ch?m sc s?c kh?e ni v?i qu v?. Hy b?o ??m qu v? ph?i th?o lu?n b?t k? v?n ?? g m qu v? c v?i chuyn gia ch?m sc s?c kh?e c?a qu v?. Document Released:  02/28/2015 Document Revised: 06/03/2016 Document Reviewed: 06/03/2016 Elsevier Interactive Patient Education  2018 [employer] Inc.  

## 2017-09-14 NOTE — Progress Notes (Addendum)
Pt discharged with infant.  Discharge instructions, prescriptions and follow up appointment given to and reviewed with pt via video interpreter. Pt verbalized understanding. Escorted out by auxillary.

## 2017-09-15 LAB — SURGICAL PATHOLOGY

## 2018-06-27 IMAGING — US US MFM OB DETAIL+14 WK
2 series · 16 of 28 positions shown · non-contrast
Comparison: none

PATIENT INFO:

PERFORMED BY:
SERVICE(S) PROVIDED:
INDICATIONS:
33 weeks gestation of pregnancy
FETAL EVALUATION:
Num Of Fetuses:     1
Fetal Heart         132
Rate(bpm):
Cardiac Activity:   Present
Presentation:       Cephalic
Placenta:           Posterior
AFI Sum(cm)     %Tile       Largest Pocket(cm)
10.51           22
RUQ(cm)       RLQ(cm)       LUQ(cm)        LLQ(cm)
3.36
BIOMETRY:
BPD:      76.2  mm     G. Age:  30w 4d          1  %    CI:        72.04   %    70 - 86
FL/HC:      20.8   %    19.9 -
HC:     285.7   mm     G. Age:  31w 3d        < 3  %    HC/AC:      1.07        0.96 -
AC:     268.2   mm     G. Age:  30w 6d          4  %    FL/BPD:     78.0   %    71 - 87
FL:       59.4  mm     G. Age:  31w 0d        < 3  %    FL/AC:      22.1   %    20 - 24
HUM:      55.9  mm     G. Age:  32w 4d         42  %
CER:      41.5  mm     G. Age:  35w 4d         75  %
CM:        3.9  mm
Est. FW:   0380    g     3 lb 11 oz      3  %
m
GESTATIONAL AGE:
LMP:           33w 2d        Date:  12/18/16                 EDD:   09/24/17
U/S Today:     31w 0d                                        EDD:   10/10/17
Best:          33w 2d     Det. By:  LMP  (12/18/16)           EDD:  09/24/17
ANATOMY:
Ventricles:            Normal appearance      Ductal Arch:            Not visualized
Cerebellum:            Within Normal          Diaphragm:              Normal appearance
Limits
Posterior Fossa:       Within Normal          Stomach:                Within Normal
Limits                                         Limits
Nuchal Fold:           Beyond 22 weeks        Abdomen:                Suboptimal
gestation
Lips:                  Normal appearance      Abdominal Wall:         Cord insertion not
seen.
Heart:                 4-Chamber view         Cord Vessels:           3 Vessels
appears normal
RVOT:                  Normal appearance      Kidneys:                Normal appearance
LVOT:                  Normal appearance      Bladder:                Within Normal
Aortic Arch:           Normal appearance      Spine:                  Normal appearance
DOPPLER - FETAL VESSELS:
Umbilical Artery
S/D     %tile     RI    %tile                     PSV
(cm/s)
2.9      65       0    < 2.5                       62
CERVIX UTERUS ADNEXA:
Cervix
Length:           3.78  cm.

[Series 1: us mfm ob detail+14 wk · 0.23mm/px · 15 of 80 slices shown (1 of 2)]
[im 1/80]
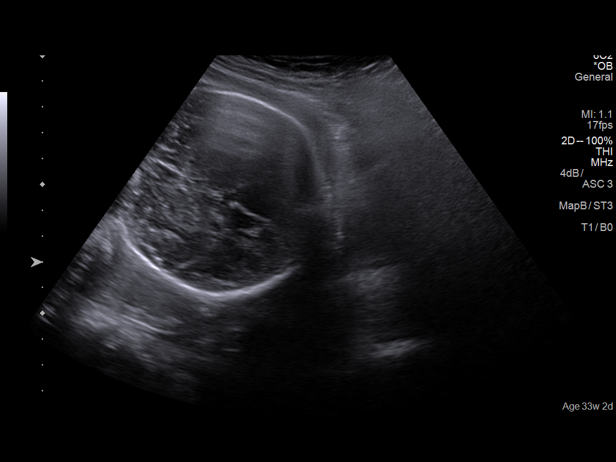
[im 7/80]
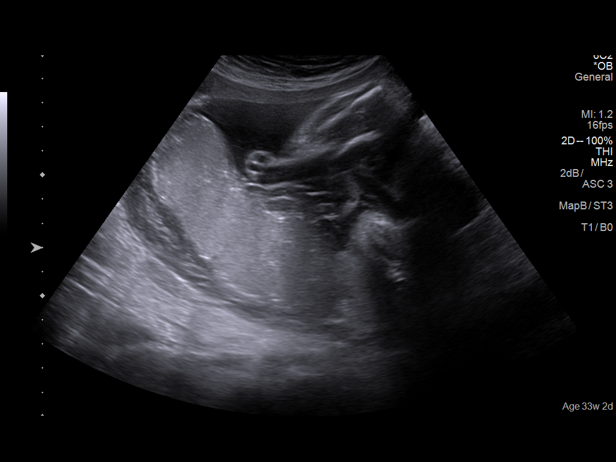
[im 13/80]
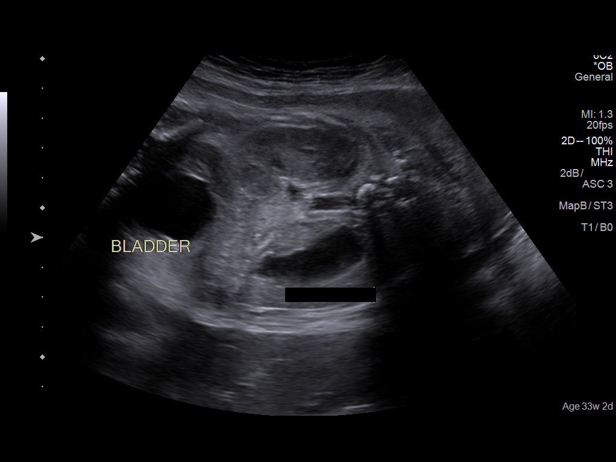
[im 19/80]
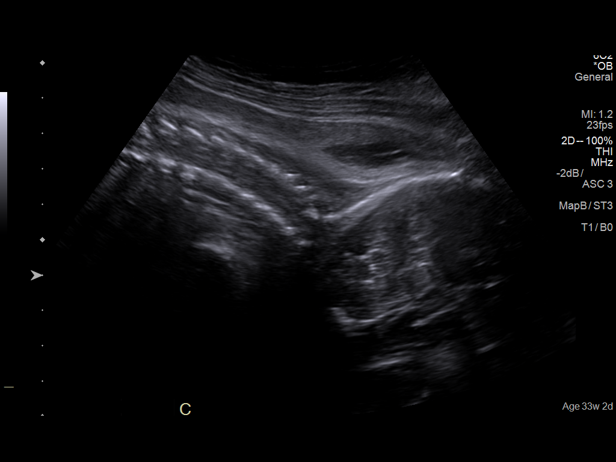
[im 23/80]
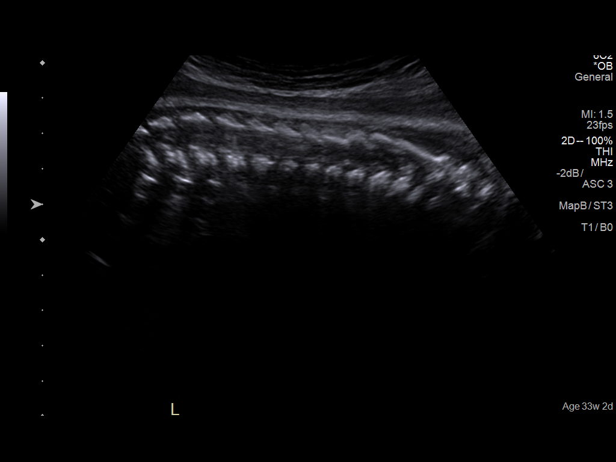
[im 29/80]
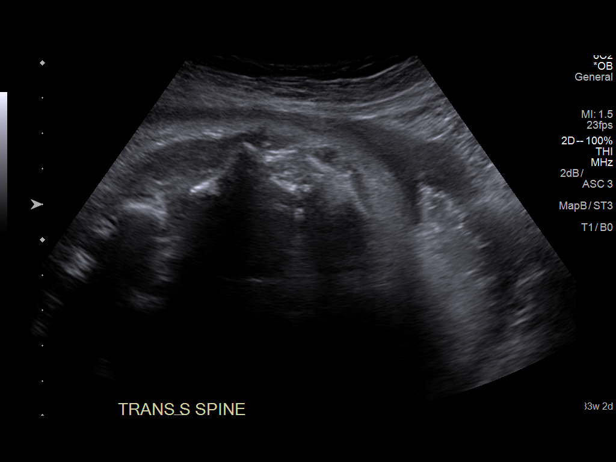
[im 35/80]
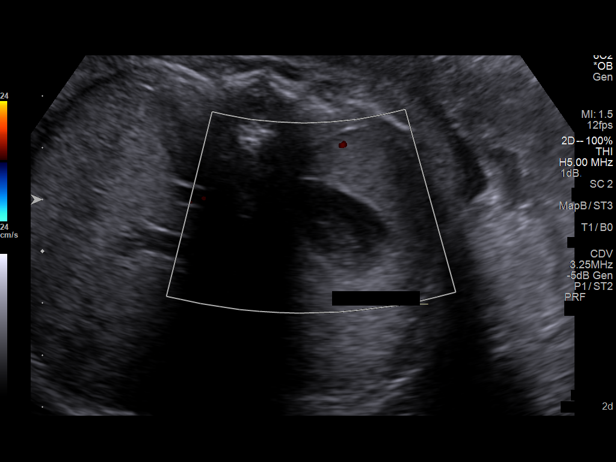
[im 42/80]
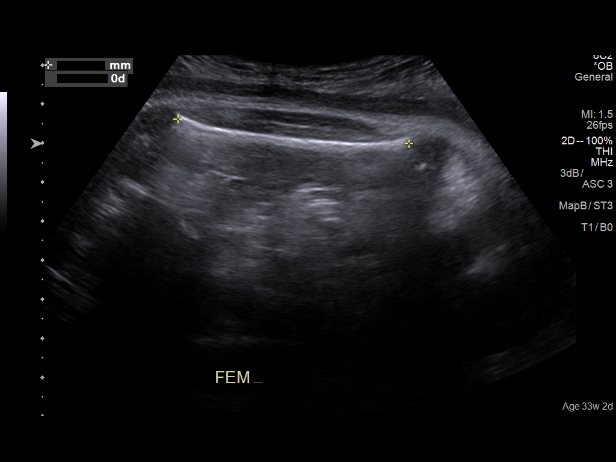
[im 45/80]
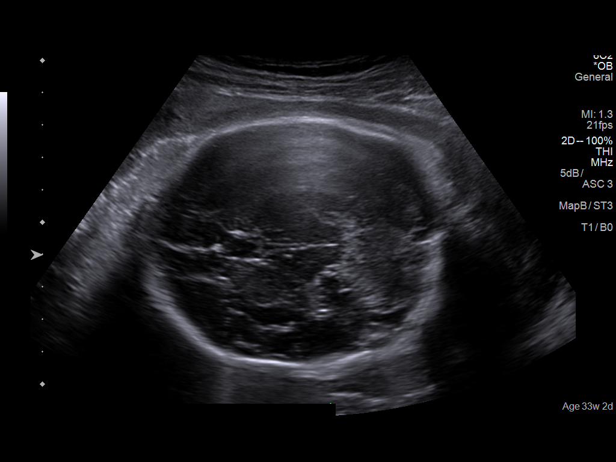
[im 51/80]
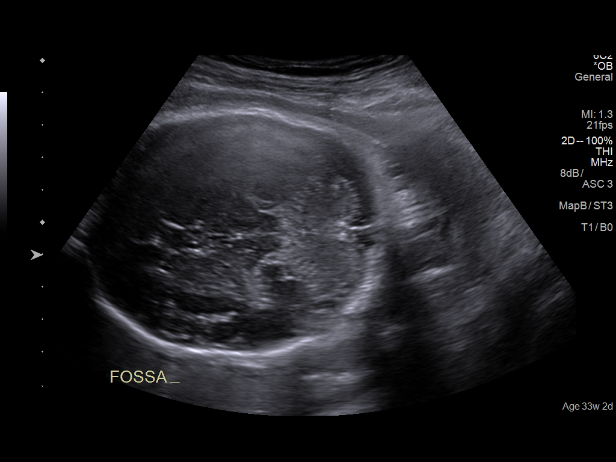
[im 57/80]
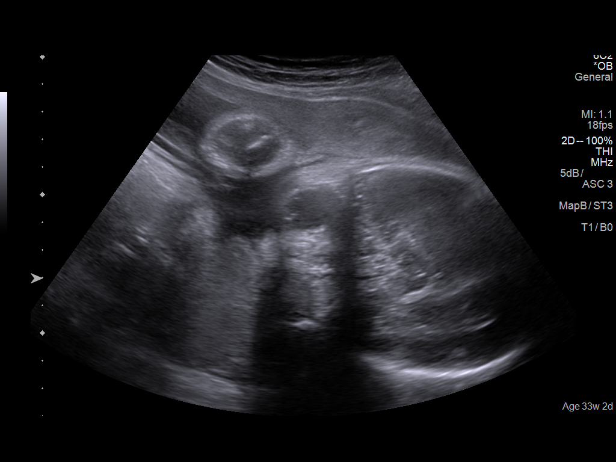
[im 64/80]
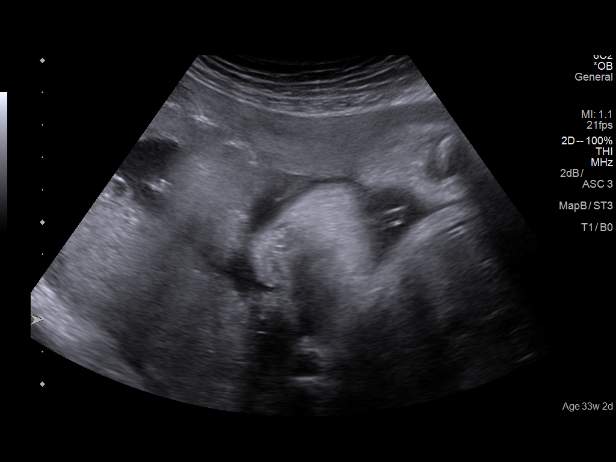
[im 67/80]
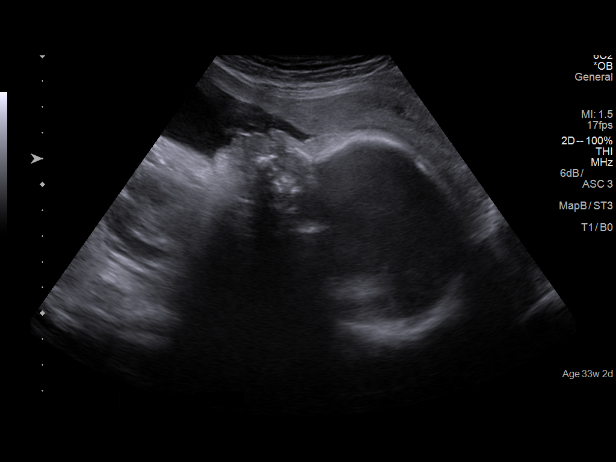
[im 73/80]
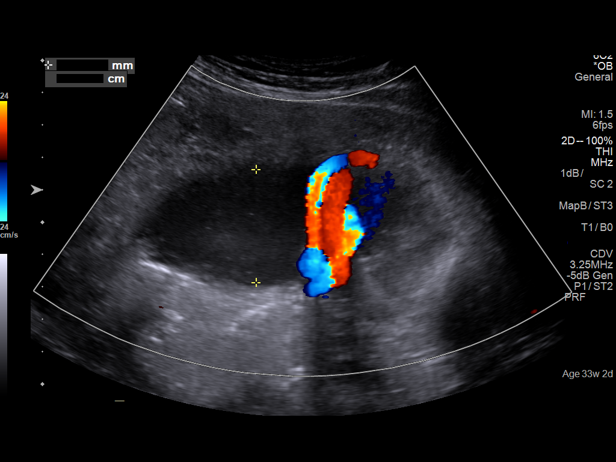
[im 80/80]
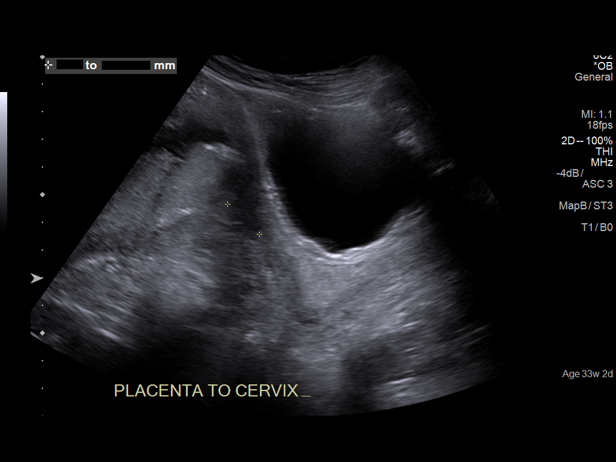

[Series 1001: us mfm ob detail+14 wk · 1 of 7 slices shown (2 of 2)]
[im 7/7]
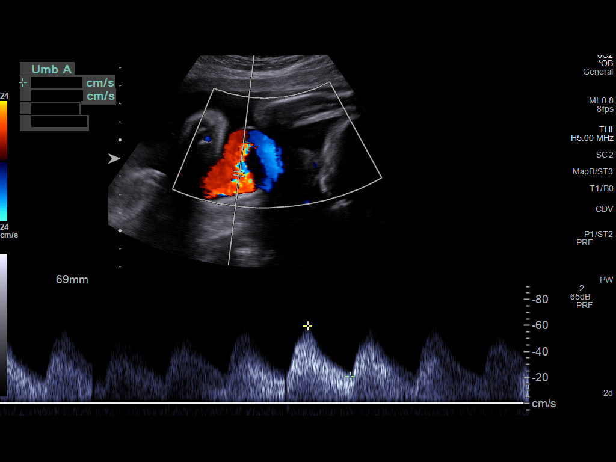

[16 of 28 positions shown; findings below may reference images not displayed]

IMPRESSION: Dear Dr. PAU,

Thank you for referring your patient to [facility] for fetal anatomical survey and
evaluation of fetal growth.  As you are aware, Ms. Germain is a
G3 VHDDH at 33 weeks and 2 days who recently moved [place] from [place].  She began her prenatal care in
[place] and was given a due date of 09/24/17 based on her
LMP.  There was concern for growth restriction at the
anatomy ultrasound on 07/07/17.

On today's ultrasound, there is a singleton gestation with
subjectively normal amniotic fluid volume.

The fetal anatomical survey appears within normal limits
within the resolution of ultrasound.  Nonetheless, all
anatomical structures could not be adequately visualized
due to the advanced gestational age.  We are unable to
adequately visualize the hands, feet and abominal cord
insertion.  The profile and orbits are also suboptimally seen
today, however I reviewed the 07/07/17 ultrasound images
and both were previously seen and appear normal on that
study.  It must be noted that a normal ultrasound cannot rule
out aneuploidy.

The placenta appears low-lying on today's images.

The fetus is growth restricted with an overall EFW at the 3rd
percentile.  The AC is at the 4th percentile.  The BPP was [DATE]
and umbilical artery Doppler S/D ratio was 2.9 (upper limit
of normal for gestational age is 3.7) with no evidence of
abscent or reversed flow.

We discussed today's findings in detail with the aid of an
interpreter.  We discussed that fetal growth restriction can
stem from multiple etiologies including constitutional (Ms.
Germain is a petite woman), fetal (no clear evidence of
aneuploidy), maternal (no known medical comorbidities at
this time) or placental causes.  We discussed a plan for
twice weekly NST with weekly AFI and Dopplers.  I also
recommend follow up growth and placental location in 3
weeks.  If continued FGR below the 5th percentile on the
next ultrasound consider delivery at 37 weeks.  Will also
consider delivery via CD based on placental location.

All questions answered.  Precautions and Asad Gashan Ladoratrice
reviewed.  Thank you for allowing us to participate in your
patient's care.

assistance.

## 2018-07-11 IMAGING — US US MFM OB LIMITED
1 series · 13 of 13 positions shown · non-contrast
Comparison: none

PATIENT INFO:

PERFORMED BY:
SERVICE(S) PROVIDED:
US MFM OB LIMITED                                     76815.01
INDICATIONS:
35 weeks gestation of pregnancy
FETAL EVALUATION:
Num Of Fetuses:     1
Fetal Heart         141
Rate(bpm):
Cardiac Activity:   Present
Presentation:       Cephalic
Placenta:           Posterior
AFI Sum(cm)     %Tile       Largest Pocket(cm)
8.13            7
RUQ(cm)       RLQ(cm)       LUQ(cm)        LLQ(cm)
2.22
GESTATIONAL AGE:
LMP:           35w 2d        Date:  12/18/16                 EDD:   09/24/17
Best:          35w 2d     Det. By:  LMP  (12/18/16)          EDD:   09/24/17
DOPPLER - FETAL VESSELS:
Umbilical Artery
S/D     %tile     RI    %tile                     PSV
(cm/s)
3.44       92   0.[REDACTED]

[Series 1: us mfm ob limited · 0.25mm/px · 13 of 13 slices shown]
[im 1/13]
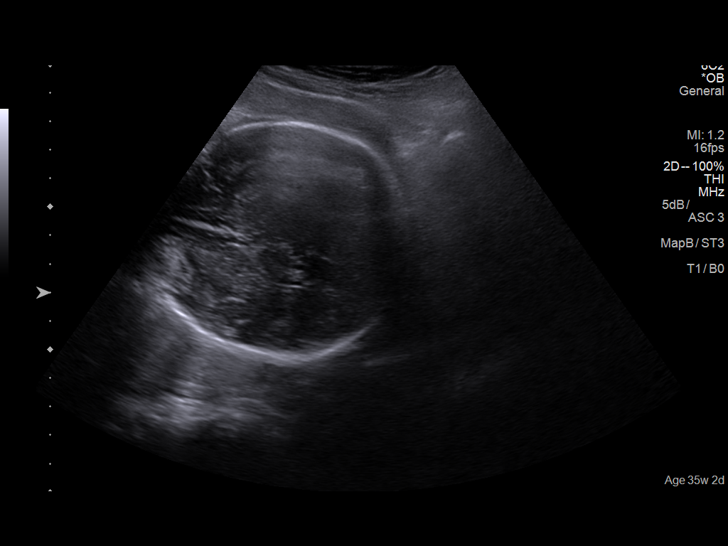
[im 2/13]
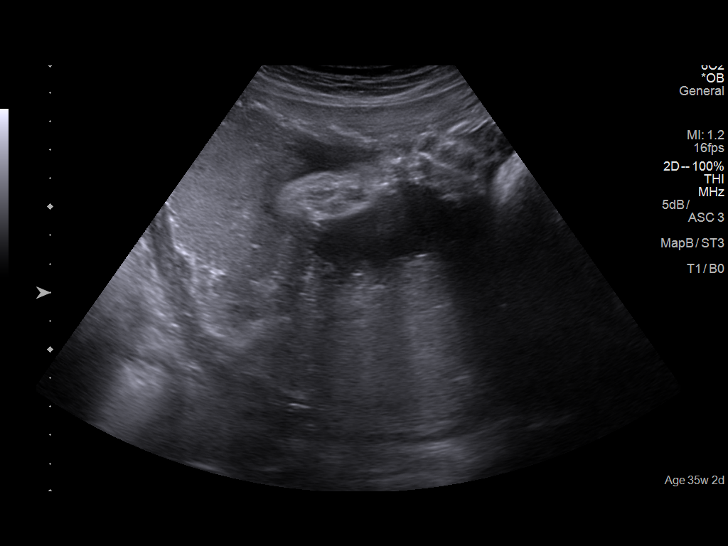
[im 3/13]
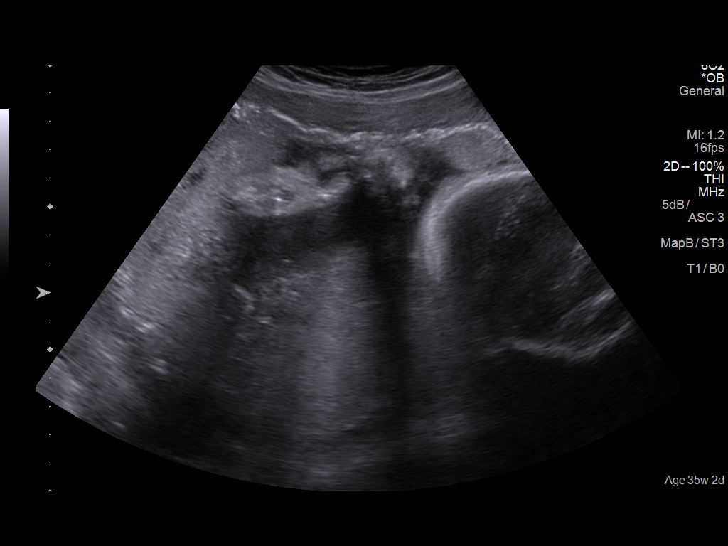
[im 4/13]
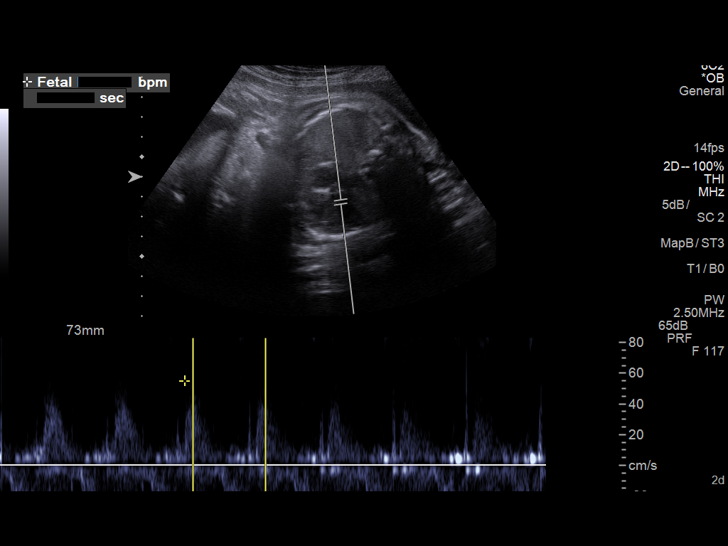
[im 5/13]
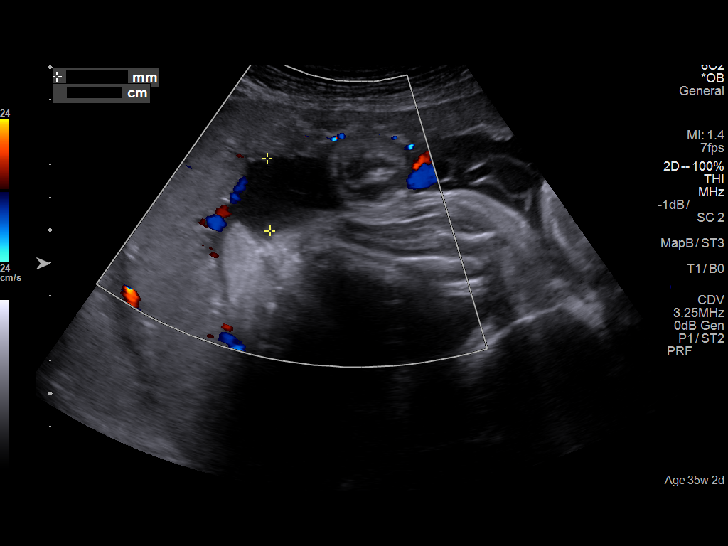
[im 6/13]
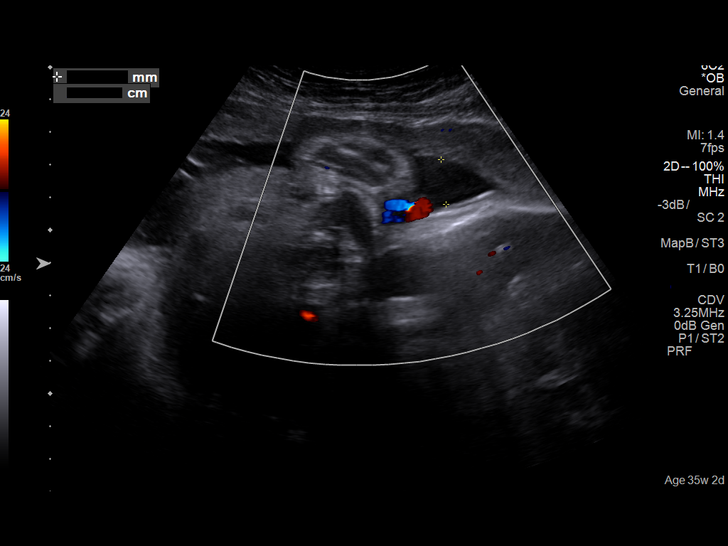
[im 7/13]
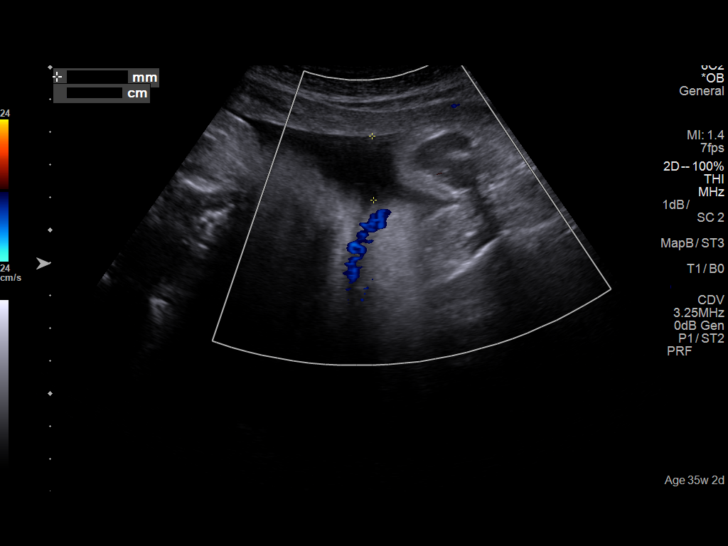
[im 8/13]
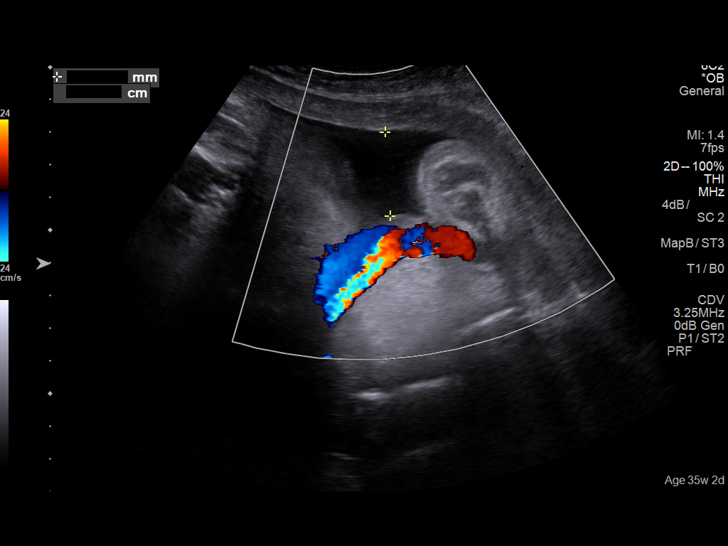
[im 9/13]
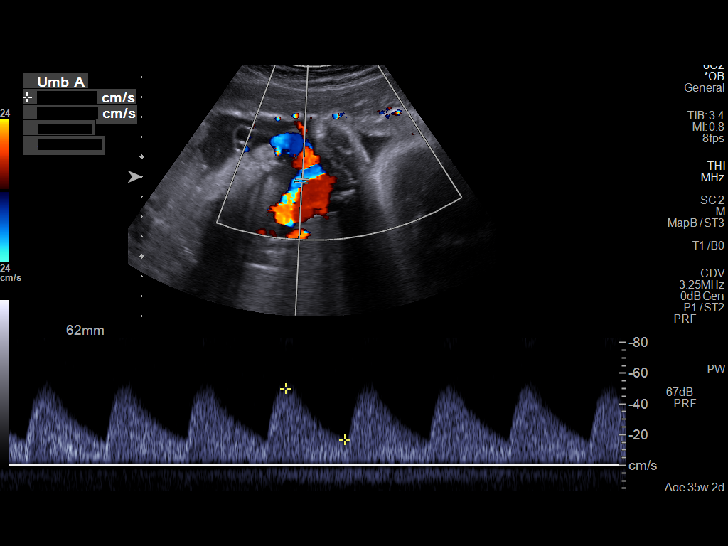
[im 10/13]
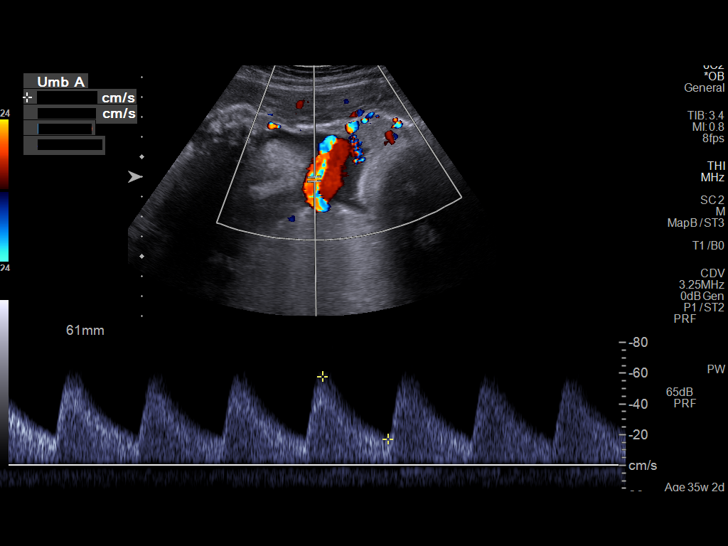
[im 11/13]
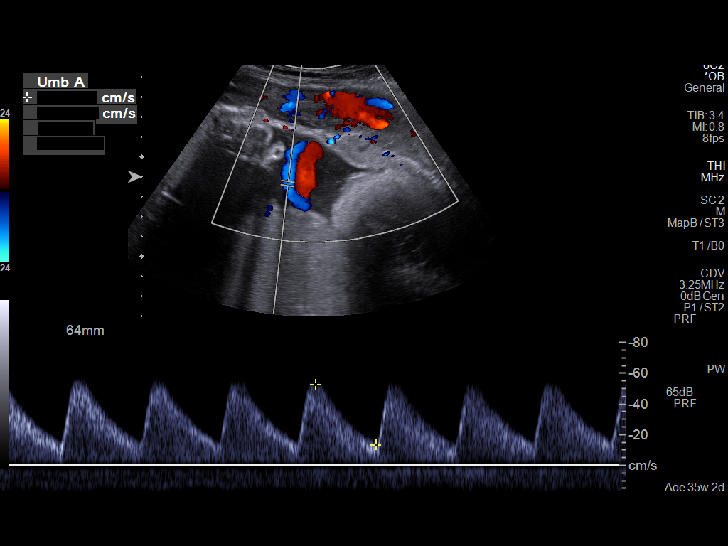
[im 12/13]
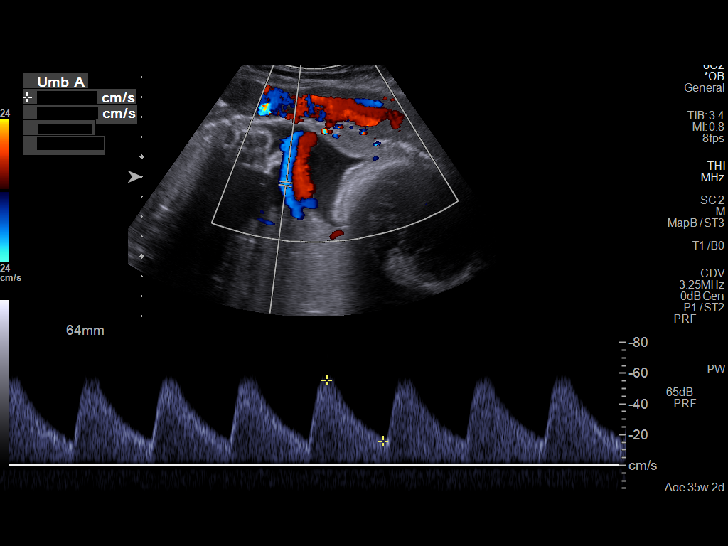
[im 13/13]
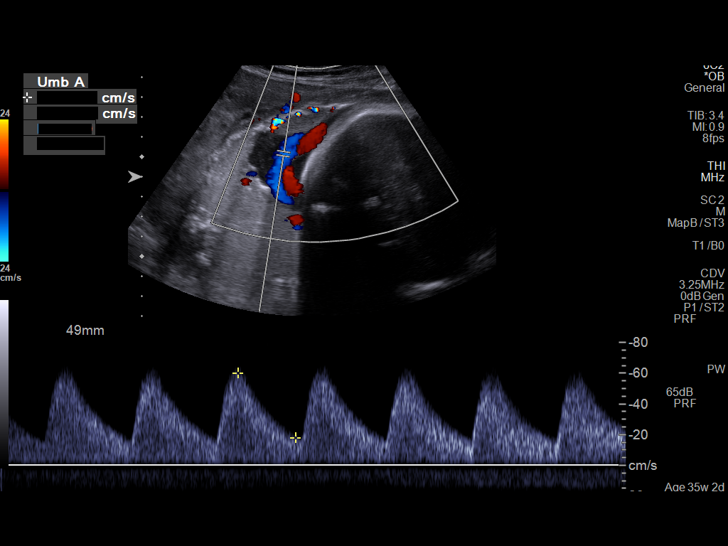

[13 of 13 positions shown; findings below may reference images not displayed]

IMPRESSION: Follow up visit for antenatal testing due to IUGR.  A single
live fetus at 35 weeks 2 days is seen. Dating is based on
earliest available ultrasound performed at [HOSPITAL] on 07/12/17;
measurements were reported as 28 weeks 2 dyas.  The
patient reports earlier ultrasounds performed in [place] prior
to [place] the [place] which confirmed her EDC by LMP.

NST reactive
BPP [DATE]
The fetus is cephalic and the AFI is 8 cm.

The umbilical artery s/d ratio is normal at 3.44 (the 95th
percentile for this gestational age is 3.5)

Continue twice weekly NSTs, weekly AFI and cord dopplers.
Next growth ultasound is [DATE] Liscutin we can reevalaute
hands/feet, abdominal cord insertion, profile, orbits and
placental location (had low lying placenta) at that time.
Visist completed with assistance of [place] interpreter.

## 2018-09-06 ENCOUNTER — Other Ambulatory Visit: Payer: Self-pay

## 2018-09-06 DIAGNOSIS — Z20822 Contact with and (suspected) exposure to covid-19: Secondary | ICD-10-CM

## 2018-09-10 LAB — NOVEL CORONAVIRUS, NAA: SARS-CoV-2, NAA: NOT DETECTED

## 2019-11-13 ENCOUNTER — Other Ambulatory Visit: Payer: Self-pay

## 2019-11-13 ENCOUNTER — Ambulatory Visit (HOSPITAL_COMMUNITY)
Admission: EM | Admit: 2019-11-13 | Discharge: 2019-11-13 | Disposition: A | Discharge status: Home / Self Care | Payer: 59 | Attending: Emergency Medicine | Admitting: Emergency Medicine

## 2019-11-13 ENCOUNTER — Encounter (HOSPITAL_COMMUNITY): Payer: Self-pay | Admitting: Emergency Medicine

## 2019-11-13 DIAGNOSIS — R110 Nausea: Secondary | ICD-10-CM

## 2019-11-13 DIAGNOSIS — Z3201 Encounter for pregnancy test, result positive: Secondary | ICD-10-CM | POA: Diagnosis not present

## 2019-11-13 LAB — POC URINE PREG, ED: Preg Test, Ur: POSITIVE — AB

## 2019-11-13 MED ORDER — DOXYLAMINE-PYRIDOXINE 10-10 MG PO TBEC: 2.0000 | DELAYED_RELEASE_TABLET | Freq: Every day | ORAL | 0 refills | Status: DC

## 2019-11-13 MED ORDER — PRENATAL COMPLETE 14-0.4 MG PO TABS: 1.0000 | ORAL_TABLET | Freq: Every day | ORAL | 0 refills | Status: DC

## 2019-11-13 NOTE — Discharge Instructions (Signed)
Pregnancy test positive Begin prenatal Follow-up with OB/GYN Diclegis: Two tablets at bedtime on day 1 and 2; if symptoms persist, take 1 tablet in morning and 2 tablets at bedtime on day 3; if symptoms persist, may increase to 1 tablet in morning, 1 tablet mid-afternoon, and 2 tablets at bedtime on day 4 (maximum: doxylamine 40 mg/pyridoxine 40 mg (4 tablets) per day). OR One-half of the 25 mg Unisom sleep tablet over-the-counter tablet or two chewable 5 mg tablets can be used off-label as an antiemetic. In addition, pyridoxine 25 mg, also available over-the-counter, is taken three or four times per day;This is a reasonable, less expensive substitute for combination tablets.  [facility] to [facility] having any abdominal pain or bleeding

## 2019-11-13 NOTE — ED Triage Notes (Signed)
Requesting pregnancy test.  lmp 09/25/2019.  Denies pain

## 2019-11-13 NOTE — ED Provider Notes (Signed)
[facility]    CSN: [id number] Arrival date & time: 11/13/19  1022      History   Chief Complaint Chief Complaint  Patient presents with   Possible Pregnancy    HPI Bonnie Overdorf is a 39 y.o. female presenting today for evaluation of possible pregnancy. Reports vomiting in the morning for 1 week. LMP 09/25/2019. Denies pain. Denies urinary symptoms. 1 prior pregnancy. Reports vomiting previously but no complications of pregnancy.   HPI  History reviewed. No pertinent past medical history.  There are no problems to display for this patient.   History reviewed. No pertinent surgical history.  OB History   No obstetric history on file.      Home Medications    Prior to Admission medications   Medication Sig Start Date End Date Taking? Authorizing Provider  Doxylamine-Pyridoxine 10-10 MG TBEC Take 2 tablets by mouth at bedtime. 11/13/19   Brain Honeycutt C, PA-C  Prenatal Vit-Fe Fumarate-FA (PRENATAL COMPLETE) 14-0.4 MG TABS Take 1 tablet by mouth daily. 11/13/19   Ailany Koren, Junius Creamer, PA-C    Family History History reviewed. No pertinent family history.  Social History Social History   Tobacco Use   Smoking status: Never Smoker  Substance Use Topics   Alcohol use: Never   Drug use: Never     Allergies   Patient has no known allergies.   Review of Systems Review of Systems  Constitutional: Negative for fever.  Respiratory: Negative for shortness of breath.   Cardiovascular: Negative for chest pain.  Gastrointestinal: Negative for abdominal pain, diarrhea, nausea and vomiting.  Genitourinary: Positive for menstrual problem. Negative for dysuria, flank pain, genital sores, hematuria, vaginal bleeding, vaginal discharge and vaginal pain.  Musculoskeletal: Negative for back pain.  Skin: Negative for rash.  Neurological: Negative for dizziness, light-headedness and headaches.     Physical Exam Triage Vital Signs ED Triage Vitals  Enc Vitals  Group     BP 11/13/19 1304 118/68     Pulse Rate 11/13/19 1304 67     Resp 11/13/19 1304 16     Temp 11/13/19 1304 98.1 F (36.7 C)     Temp Source 11/13/19 1304 Oral     SpO2 11/13/19 1304 100 %     Weight --      Height --      Head Circumference --      Peak Flow --      Pain Score 11/13/19 1301 0     Pain Loc --      Pain Edu? --      Excl. in GC? --    No data found.  Updated Vital Signs BP 118/68 (BP Location: Left Arm)    Pulse 67    Temp 98.1 F (36.7 C) (Oral)    Resp 16    LMP 09/25/2019    SpO2 100%   Visual Acuity Right Eye Distance:   Left Eye Distance:   Bilateral Distance:    Right Eye Near:   Left Eye Near:    Bilateral Near:     Physical Exam Vitals and nursing note reviewed.  Constitutional:      Appearance: She is well-developed.     Comments: No acute distress  HENT:     Head: Normocephalic and atraumatic.     Nose: Nose normal.  Eyes:     Conjunctiva/sclera: Conjunctivae normal.  Cardiovascular:     Rate and Rhythm: Normal rate.  Pulmonary:     Effort: Pulmonary effort  is normal. No respiratory distress.  Abdominal:     General: There is no distension.  Musculoskeletal:        General: Normal range of motion.     Cervical back: Neck supple.  Skin:    General: Skin is warm and dry.  Neurological:     Mental Status: She is alert and oriented to person, place, and time.      UC Treatments / Results  Labs (all labs ordered are listed, but only abnormal results are displayed) Labs Reviewed  POC URINE PREG, ED - Abnormal; Notable for the following components:      Result Value   Preg Test, Ur POSITIVE (*)    All other components within normal limits    EKG   Radiology No results found.  Procedures Procedures (including critical care time)  Medications Ordered in UC Medications - No data to display  Initial Impression / Assessment and Plan / UC Course  I have reviewed the triage vital signs and the nursing  notes.  Pertinent labs & imaging results that were available during my care of the patient were reviewed by me and considered in my medical decision making (see chart for details).     Pregnancy test positive.  Initiating on prenatal.  Diclegis for nausea, follow-up with OB/GYN.  Discussed strict return precautions. Patient verbalized understanding and is agreeable with plan.  Final Clinical Impressions(s) / UC Diagnoses   Final diagnoses:  Positive pregnancy test  Nausea     Discharge Instructions     Pregnancy test positive Begin prenatal Follow-up with OB/GYN Diclegis: Two tablets at bedtime on day 1 and 2; if symptoms persist, take 1 tablet in morning and 2 tablets at bedtime on day 3; if symptoms persist, may increase to 1 tablet in morning, 1 tablet mid-afternoon, and 2 tablets at bedtime on day 4 (maximum: doxylamine 40 mg/pyridoxine 40 mg (4 tablets) per day). OR One-half of the 25 mg Unisom sleep tablet over-the-counter tablet or two chewable 5 mg tablets can be used off-label as an antiemetic. In addition, pyridoxine 25 mg, also available over-the-counter, is taken three or four times per day;This is a reasonable, less expensive substitute for combination tablets.  [facility] to [facility] having any abdominal pain or bleeding    ED Prescriptions    Medication Sig Dispense Auth. Provider   Doxylamine-Pyridoxine 10-10 MG TBEC Take 2 tablets by mouth at bedtime. 60 tablet Quana Chamberlain C, PA-C   Prenatal Vit-Fe Fumarate-FA (PRENATAL COMPLETE) 14-0.4 MG TABS Take 1 tablet by mouth daily. 60 tablet Mykael Trott, [place] C, PA-C     PDMP not reviewed this encounter.   Lew Dawes, [place] 11/13/19 1334

## 2019-12-04 LAB — OB RESULTS CONSOLE RPR: RPR: NONREACTIVE

## 2019-12-04 LAB — OB RESULTS CONSOLE ABO/RH: RH Type: POSITIVE

## 2019-12-04 LAB — OB RESULTS CONSOLE HGB/HCT, BLOOD
HCT: 41 (ref 29–41)
Hemoglobin: 14

## 2019-12-04 LAB — OB RESULTS CONSOLE HEPATITIS B SURFACE ANTIGEN: Hepatitis B Surface Ag: NEGATIVE

## 2019-12-04 LAB — OB RESULTS CONSOLE RUBELLA ANTIBODY, IGM: Rubella: IMMUNE

## 2019-12-04 LAB — OB RESULTS CONSOLE HIV ANTIBODY (ROUTINE TESTING): HIV: NONREACTIVE

## 2019-12-04 LAB — OB RESULTS CONSOLE PLATELET COUNT: Platelets: 279

## 2019-12-04 LAB — OB RESULTS CONSOLE ANTIBODY SCREEN: Antibody Screen: NEGATIVE

## 2020-01-24 ENCOUNTER — Encounter: Payer: 59 | Admitting: Obstetrics & Gynecology

## 2020-02-01 ENCOUNTER — Inpatient Hospital Stay (HOSPITAL_COMMUNITY)
Admission: AD | Admit: 2020-02-01 | Discharge: 2020-02-01 | Disposition: A | Discharge status: Home / Self Care | Payer: 59 | Attending: Obstetrics and Gynecology | Admitting: Obstetrics and Gynecology

## 2020-02-01 ENCOUNTER — Other Ambulatory Visit: Payer: Self-pay

## 2020-02-01 ENCOUNTER — Ambulatory Visit (HOSPITAL_COMMUNITY)
Admission: EM | Admit: 2020-02-01 | Discharge: 2020-02-01 | Disposition: A | Discharge status: Home / Self Care | Payer: 59

## 2020-02-01 ENCOUNTER — Encounter (HOSPITAL_COMMUNITY): Payer: Self-pay | Admitting: Obstetrics and Gynecology

## 2020-02-01 DIAGNOSIS — Z3A18 18 weeks gestation of pregnancy: Secondary | ICD-10-CM | POA: Diagnosis not present

## 2020-02-01 DIAGNOSIS — O09522 Supervision of elderly multigravida, second trimester: Secondary | ICD-10-CM | POA: Diagnosis not present

## 2020-02-01 DIAGNOSIS — R319 Hematuria, unspecified: Secondary | ICD-10-CM | POA: Insufficient documentation

## 2020-02-01 DIAGNOSIS — Z79899 Other long term (current) drug therapy: Secondary | ICD-10-CM | POA: Diagnosis not present

## 2020-02-01 DIAGNOSIS — O26892 Other specified pregnancy related conditions, second trimester: Secondary | ICD-10-CM | POA: Diagnosis present

## 2020-02-01 DIAGNOSIS — O2342 Unspecified infection of urinary tract in pregnancy, second trimester: Secondary | ICD-10-CM | POA: Diagnosis not present

## 2020-02-01 DIAGNOSIS — M545 Low back pain, unspecified: Secondary | ICD-10-CM | POA: Diagnosis not present

## 2020-02-01 HISTORY — DX: Other specified health status: Z78.9

## 2020-02-01 LAB — URINALYSIS, ROUTINE W REFLEX MICROSCOPIC
Bilirubin Urine: NEGATIVE
Glucose, UA: NEGATIVE mg/dL
Ketones, ur: NEGATIVE mg/dL
Nitrite: POSITIVE — AB
Protein, ur: NEGATIVE mg/dL
Specific Gravity, Urine: 1.005 (ref 1.005–1.030)
pH: 7 (ref 5.0–8.0)

## 2020-02-01 LAB — CBC
HCT: 38.8 % (ref 36.0–46.0)
Hemoglobin: 13.3 g/dL (ref 12.0–15.0)
MCH: 31.3 pg (ref 26.0–34.0)
MCHC: 34.3 g/dL (ref 30.0–36.0)
MCV: 91.3 fL (ref 80.0–100.0)
Platelets: 199 10*3/uL (ref 150–400)
RBC: 4.25 MIL/uL (ref 3.87–5.11)
RDW: 12.4 % (ref 11.5–15.5)
WBC: 9.1 10*3/uL (ref 4.0–10.5)
nRBC: 0 % (ref 0.0–0.2)

## 2020-02-01 LAB — COMPREHENSIVE METABOLIC PANEL
ALT: 14 U/L (ref 0–44)
AST: 20 U/L (ref 15–41)
Albumin: 3 g/dL — ABNORMAL LOW (ref 3.5–5.0)
Alkaline Phosphatase: 46 U/L (ref 38–126)
Anion gap: 9 (ref 5–15)
BUN: 6 mg/dL (ref 6–20)
CO2: 24 mmol/L (ref 22–32)
Calcium: 8.6 mg/dL — ABNORMAL LOW (ref 8.9–10.3)
Chloride: 100 mmol/L (ref 98–111)
Creatinine, Ser: 0.7 mg/dL (ref 0.44–1.00)
GFR, Estimated: >60 mL/min (ref >60)
Glucose, Bld: 79 mg/dL (ref 70–99)
Potassium: 3.8 mmol/L (ref 3.5–5.1)
Sodium: 133 mmol/L — ABNORMAL LOW (ref 135–145)
Total Bilirubin: 0.5 mg/dL (ref 0.3–1.2)
Total Protein: 6.7 g/dL (ref 6.5–8.1)

## 2020-02-01 MED ORDER — LIDOCAINE HCL (PF) 1 % IJ SOLN
2.1000 mL | Freq: Once | INTRAMUSCULAR | Status: AC
  Administered 2020-02-01: 13:00:00 2.1 mL
  Filled 2020-02-01: qty 5

## 2020-02-01 MED ORDER — CEFTRIAXONE SODIUM 1 G IJ SOLR
1000.0000 mg | Freq: Once | INTRAMUSCULAR | Status: AC
  Administered 2020-02-01: 13:00:00 1000 mg via INTRAMUSCULAR
  Filled 2020-02-01: qty 10

## 2020-02-01 MED ORDER — CEFADROXIL 500 MG PO CAPS: 500.0000 mg | ORAL_CAPSULE | Freq: Two times a day (BID) | ORAL | 0 refills | Status: DC

## 2020-02-01 MED ORDER — ACETAMINOPHEN 500 MG PO TABS
1000.0000 mg | ORAL_TABLET | Freq: Once | ORAL | Status: AC
  Administered 2020-02-01: 12:00:00 1000 mg via ORAL
  Filled 2020-02-01: qty 2

## 2020-02-01 NOTE — MAU Note (Addendum)
Patient reports having a fever 2 days ago (but did not check her temperature) and just feels unwell.  Also reports body aches, lower back pain and burning w/ urination.  Denies VB.

## 2020-02-01 NOTE — MAU Provider Note (Signed)
History     CSN: [id number]  Arrival date and time: 02/01/20 7782   Event Date/Time   First Provider Initiated Contact with Patient 02/01/20 1121      Chief Complaint  Patient presents with  . Dysuria   HPI Deborah Davidson is a 39 y.o. G2P1001 at [redacted]w[redacted]d who presents to MAU with chief complaint of dysuria and hematuria. These are new problems, onset two days ago. Patient denies history of UTI this pregnancy.   Patient also c/o fever, onset coinciding with onset of her urinary symptoms. She states she does not own a thermometer but felt warm. She also reports generalized body aches and lower back pain. She denies frequency, exposure to ill people.   She denies abdominal tenderness and flank pain. She denies vaginal bleeding, leaking of fluid, fever, falls, or recent illness.   She receives care with [facility].  OB History    Gravida  2   Para  1   Term  1   Preterm      AB      Living  1     SAB      IAB      Ectopic      Multiple      Live Births  1           Past Medical History:  Diagnosis Date  . Medical history non-contributory     Past Surgical History:  Procedure Laterality Date  . NO PAST SURGERIES      History reviewed. No pertinent family history.  Social History   Tobacco Use  . Smoking status: Never Smoker  . Smokeless tobacco: Never Used  Vaping Use  . Vaping Use: Never used  Substance Use Topics  . Alcohol use: Never  . Drug use: Never    Allergies: No Known Allergies  Medications Prior to Admission  Medication Sig Dispense Refill Last Dose  . Doxylamine-Pyridoxine 10-10 MG TBEC Take 2 tablets by mouth at bedtime. 60 tablet 0   . Prenatal Vit-Fe Fumarate-FA (PRENATAL COMPLETE) 14-0.4 MG TABS Take 1 tablet by mouth daily. 60 tablet 0 01/28/2020    Review of Systems  Genitourinary: Positive for hematuria. Negative for flank pain.  All other systems reviewed and are negative.  Physical Exam   Blood pressure 123/82,  pulse 94, temperature 97.8 F (36.6 C), resp. rate 15, weight 60.1 kg, last menstrual period 09/25/2019.  Physical Exam Vitals and nursing note reviewed. Exam conducted with a chaperone present.  Constitutional:      Appearance: She is not ill-appearing or toxic-appearing.  HENT:     Mouth/Throat:     Mouth: Mucous membranes are moist.  Cardiovascular:     Rate and Rhythm: Normal rate.     Pulses: Normal pulses.     Heart sounds: Normal heart sounds.  Pulmonary:     Effort: Pulmonary effort is normal.     Breath sounds: Normal breath sounds.  Abdominal:     General: Abdomen is flat. Bowel sounds are normal.     Palpations: Abdomen is soft.     Tenderness: There is no abdominal tenderness. There is no right CVA tenderness or left CVA tenderness.  Musculoskeletal:       Back:  Skin:    General: Skin is warm and dry.     Capillary Refill: Capillary refill takes less than 2 seconds.  Neurological:     Mental Status: She is alert and oriented to person, place, and time.  Psychiatric:        Mood and Affect: Mood normal.        Thought Content: Thought content normal.        Judgment: Judgment normal.     MAU Course  Procedures  --Records from [facility] scanned into chart. UTI diagnosed 10/2019, patient did not take medicine. Discussed at [facility] per prenatal records, prescription re-sent. Patient unaware/has not taken.   ---Locus of back pain is lumbar region  --Pertinent negatives: fever, CVAT, flank pain, elevated WBCs  --Care coordinated with Dr. Donavan Foil and Dr. Claiborne Billings  Orders Placed This Encounter  Procedures  . Culture, OB Urine  . Urinalysis, Routine w reflex microscopic Urine, Clean Catch  . CBC  . Comprehensive metabolic panel   Patient Vitals for the past 24 hrs:  BP Temp Temp src Pulse Resp SpO2 Weight  02/01/20 1347 101/69 -- -- 78 17 -- --  02/01/20 1305 101/71 97.9 F (36.6 C) Oral 74 18 99 % --  02/01/20 1107 123/82 97.8 F (36.6 C) -- 94 15 --  60.1 kg   Results for orders placed or performed during the hospital encounter of 02/01/20 (from the past 24 hour(s))  Urinalysis, Routine w reflex microscopic Urine, Clean Catch     Status: Abnormal   Collection Time: 02/01/20  9:53 AM  Result Value Ref Range   Color, Urine YELLOW YELLOW   APPearance HAZY (A) CLEAR   Specific Gravity, Urine 1.005 1.005 - 1.030   pH 7.0 5.0 - 8.0   Glucose, UA NEGATIVE NEGATIVE mg/dL   Hgb urine dipstick LARGE (A) NEGATIVE   Bilirubin Urine NEGATIVE NEGATIVE   Ketones, ur NEGATIVE NEGATIVE mg/dL   Protein, ur NEGATIVE NEGATIVE mg/dL   Nitrite POSITIVE (A) NEGATIVE   Leukocytes,Ua SMALL (A) NEGATIVE   RBC / HPF 0-5 0 - 5 RBC/hpf   WBC, UA 6-10 0 - 5 WBC/hpf   Bacteria, UA MANY (A) NONE SEEN   Squamous Epithelial / LPF 0-5 0 - 5  CBC     Status: None   Collection Time: 02/01/20 11:26 AM  Result Value Ref Range   WBC 9.1 4.0 - 10.5 K/uL   RBC 4.25 3.87 - 5.11 MIL/uL   Hemoglobin 13.3 12.0 - 15.0 g/dL   HCT 32.9 51.8 - 84.1 %   MCV 91.3 80.0 - 100.0 fL   MCH 31.3 26.0 - 34.0 pg   MCHC 34.3 30.0 - 36.0 g/dL   RDW 66.0 63.0 - 16.0 %   Platelets 199 150 - 400 K/uL   nRBC 0.0 0.0 - 0.2 %  Comprehensive metabolic panel     Status: Abnormal   Collection Time: 02/01/20 11:26 AM  Result Value Ref Range   Sodium 133 (L) 135 - 145 mmol/L   Potassium 3.8 3.5 - 5.1 mmol/L   Chloride 100 98 - 111 mmol/L   CO2 24 22 - 32 mmol/L   Glucose, Bld 79 70 - 99 mg/dL   BUN 6 6 - 20 mg/dL   Creatinine, Ser 1.09 0.44 - 1.00 mg/dL   Calcium 8.6 (L) 8.9 - 10.3 mg/dL   Total Protein 6.7 6.5 - 8.1 g/dL   Albumin 3.0 (L) 3.5 - 5.0 g/dL   AST 20 15 - 41 U/L   ALT 14 0 - 44 U/L   Alkaline Phosphatase 46 38 - 126 U/L   Total Bilirubin 0.5 0.3 - 1.2 mg/dL   GFR, Estimated >32 >35 mL/min   Anion gap 9 5 -  15   Meds ordered this encounter  Medications  . acetaminophen (TYLENOL) tablet 1,000 mg  . cefTRIAXone (ROCEPHIN) injection 1,000 mg    Pt has failed  outpatient PO Augmentin twice    Order Specific Question:   Antibiotic Indication:    Answer:   UTI  . lidocaine (PF) (XYLOCAINE) 1 % injection 2.1 mL  . cefadroxil (DURICEF) 500 MG capsule    Sig: Take 1 capsule (500 mg total) by mouth 2 (two) times daily.    Dispense:  14 capsule    Refill:  0    Order Specific Question:   Supervising Provider    Answer:   Mariel Aloe A [[id number]]    Assessment and Plan  --39 y.o. G2P1001 at [redacted]w[redacted]d  --Urinary Tract Infection, treatment initiated in MAU --FHT 160 by Doppler --Rx adherence and completion of medication strongly underscored --Language barrier: remote interpreter utilized for all patient interaction --Discharge home in stable condition with strict return precautions  Calvert Cantor, CNM 02/01/2020, 3:31 PM

## 2020-02-01 NOTE — Discharge Instructions (Signed)
[place] k? v nhi?m trng ???ng ti?u Pregnancy and Urinary Tract Infection  Nhi?m trng ???ng ti?u (Urinary Tract Infection, UTI) l m?t b?nh nhi?m trng ? b?t k? b? ph?n no c?a ???ng ti?u. ???ng ti?u ny bao g?m th?n, cc ?ng k?t n?i th?n v?i bng quang (ni?u qu?n), bng quang v ?ng d?n n??c ti?u ra kh?i c? th? (ni?u ??o). Nh??ng c? quan na?y ta?o ra, ch??a va? tha?i n???c ti?u trong c? th? ra ngoi. Chuyn gia ch?m [place] s?c kh?e c th? s? d?ng cc tn khc ?? m t? b?nh nhi?m trng. UTI trn ?nh h??ng ??n ni?u qu?n v th?n (vim b? th?n). UTI d??i ?nh h??ng ??n bng quang (vim bng quang) v ni?u ??o (vim ni?u ??o). H?u h?t cc tr??ng h?p nhi?m trng ???ng ti?u l do vi khu?n ? vng sinh d?c c?a qu v?, quanh l?i vo ???ng ti?u (ni?u ??o) gy ra. Nh?ng vi khu?n ny pht tri?n, gy kch thch v gy vim ???ng ti?u c?a qu v?. Qu v? nhi?u kh? n?ng m?c UTI trong [place] k? b?i v nh?ng thay ??i v? th? ch?t v hc mn m c? th? qu v? tr?i qua c th? lm cho vi khu?n d? thm nh?p vo ???ng ti?u h?n. [place] nhi l?n d?n c?ng t?ng p l?c ln bng quang v c th? ?nh h??ng ??n dng n??c ti?u. ?i?u quan tr?ng l nh?n bi?t v ?i?u tr? UTI trong [place] k? v nguy c? bi?n ch?ng nghim tr?ng cho c? qu v? v con qu v?. Giai ?o?n ny ?nh h??ng ??n ti nh? th? no? Cc tri?u ch?ng c?a UTI bao g?m:  C?n ?i ti?u ngay l?p t?c (mt ti?u).  Ti?u ti?n th???ng xuyn ho??c ?i l???ng nho? n???c ti?u th???ng xuyn.  ?au ho??c no?ng rt khi ti?u ti?n.  Mu trong n??c ti?u.  N???c ti?u co? mu?i hi ho??c b?t th???ng.  Kho? ?i ti?u.  N???c ti?u v?n ?u?c.  ?au ? b?ng ho??c l?ng d??i.  Ra kh h? ? m ??o. Qu v? c?ng c th? ???c:  Nn ho?c gi?m c?m gic thm ?n.  L l?n.  Kch thch ho?c m?t m?i.  S?t.  Tiu ch?y. ?i?u ny ?nh h??ng ??n con ti nh? th? no? UTI trong [place] k? khng ???c ?i?u tr? c th? d?n ??n nhi?m trng th?n ho?c nhi?m trng ton thn gy ra nh?ng v?n ?? v? s?c kh?e c th? ?nh h??ng  ??n con qu v?. Cc bi?n ch?ng c th? x?y ra c?a UTI khng ???c ?i?u tr? bao g?m:  Sinh con tr??c 39 tu?n c?a [place] k? (sinh non).  Tr?ng l??ng tr? s? sinh th?p.  B? huy?t p cao trong [place] k? (ti?n s?n gi?t).  C n?ng ?? hemoglobin th?p (thi?u mu). Ti c th? lm g ?? gi?m nguy c?? ?? ng?n ng?a UTI:  ?i v? sinh ngay khi qu v? c?m th?y c?n. Khng nhi?n ?i ti?u trong th??i gian da?i.  Lun lau t? pha tr??c ra sau, ??c bi?t l sau khi ?i ??i ti?n. S?? du?ng m?i t? kh?n gi?y m?t l?n khi quy? vi? lau.  ?i ti?u sau khi quan h? tnh d?c.  Gi? cho vng sinh d?c c?a qu v? kh.  U?ng 6-10 c?c n??c m?i ngy.  Khng th?t r?a ho?c s? d?ng bnh x?t kh? mi. Tnh tr?ng ny ???c ?i?u tr? nh? th? no? ?i?u tr? tnh tr?ng ny c th? bao g?m:  Cc thu?c khng sinh s? d?ng an ton trong [place] k?.  Ca?c thu?c kha?c ?? ?i?u tri? ca?c nguyn nhn i?t ph? bi?n h?n gy UTI. Tun th? nh?ng h??ng d?n ny ? nh:  N?u qu v? ???c k thu?c khng sinh, hy dng thu?c theo ch? d?n c?a chuyn gia ch?m [place] s?c kh?e. Khng d?ng s? d?ng thu?c khng sinh ngay c? khi qu v? b?t ??u c?m th?y ?? h?n.  Tun th? t?t c? cc l?n khm theo di theo ch? d?n c?a chuyn gia ch?m [place] s?c kh?e. ?i?u ny c vai tr quan tr?ng. Hy lin l?c v?i chuyn gia ch?m [place] s?c kh?e n?u:  Cc tri?u ch?ng c?a qu v? khng c?i thi?n, ho?c tr? nn tr?m tr?ng h?n.  Qu v? ra d?ch b?t th??ng ? m ??o. Yu c?u tr? gip ngay l?p t?c n?u qu v?:  B? s?t.  B? bu?n nn v nn.  ?au l?ng ho?c m?ng s??n.  C?m th?y cc c?n co th?t trong t? cung.  ?au b?ng d??i.  Qu v? c d?ch ch?y ra ? m ??o.  C mu trong n??c ti?u. Tm t?t  Nhi?m tru?ng ????ng ti?u (UTI) la? m?t nhi?m tru?ng ?? b?t c?? ph?n na?o cu?a ????ng ti?u, bao g?m th?n, ni?u qua?n, ba?ng quang va? ni?u ?a?o.  H?u h?t cc tr??ng h?p nhi?m trng ???ng ti?u l do vi khu?n ? vng sinh d?c c?a qu v?, quanh l?i vo ???ng ti?u (ni?u ??o) gy ra.  Qu  v? c nhi?u kh? n?ng b? UTI trong [place] k?.  N?u qu v? ???c k thu?c khng sinh, hy dng thu?c theo ch? d?n c?a chuyn gia ch?m [place] s?c kh?e. Khng d?ng s? d?ng thu?c khng sinh ngay c? khi qu v? b?t ??u c?m th?y ?? h?n. Thng tin ny khng nh?m m?c ?ch thay th? cho l?i khuyn m chuyn gia ch?m [place] s?c kh?e ni v?i qu v?. Hy b?o ??m qu v? ph?i th?o lu?n b?t k? v?n ?? g m qu v? c v?i chuyn gia ch?m [place] s?c kh?e c?a qu v?. Document Revised: 02/20/2018 Document Reviewed: 02/20/2018 Elsevier Patient Education  2020 [employer].

## 2020-02-03 LAB — CULTURE, OB URINE: Culture: >=100000 — AB

## 2020-02-07 ENCOUNTER — Encounter: Payer: 59 | Admitting: Obstetrics

## 2020-02-16 NOTE — L&D Delivery Note (Signed)
OB/GYN Faculty Practice Delivery Note  Deborah Davidson is a 40 y.o. G2P1001 s/p VAVD at [redacted]w[redacted]d. She was admitted for IOL for gHTN.   ROM: 9h 75m with clear fluid GBS Status: Negative/-- (04/19 0915) Maximum Maternal Temperature: 98.1  Delivery Date/Time: 01:47 on 4/28   Indication for operative vaginal delivery: maternal exhaustion proximal to delivery  Patient was examined and found to be fully dilated with fetal station of +3.  Patient's bladder was drained prior to delivery, and there were no known fetal contraindications to operative vaginal delivery. FHR tracing remarkable for cat II tracing with intermittent late decelerations.   Risks of vacuum assistance were discussed in detail using video interpreter, including but not limited to, bleeding, infection, damage to maternal tissues, fetal cephalohematoma, inability to effect vaginal delivery of the head or shoulder dystocia that cannot be resolved by established maneuvers and need for emergency cesarean section.  Patient gave verbal consent.  The kiwi vacuum cup was positioned over the sagittal suture 3 cm anterior to posterior fontanelle.  Pressure was then increased to 500 mmHg, and the patient was instructed to push.  Pulling was administered along the pelvic curve while patient was pushing; there were 2 contractions and 1 popoff.  Vacuum was reduced in between contractions. After head delivered, a brief shoulder dystocia occurred. It was relieved with mcrobert's and delivery of the posterior arm. Total dystocia less than 30 seconds. The infant was then delivered atraumatically, noted to be a viable female infant. Cord cut immediately due to poor tone. Apgars of 1/6/8.  Neonatology present for delivery.  There was spontaneous placental delivery, intact with three-vessel cord.  First degree perineal laceration noted requiring repair with 3-0 Vicryl in the usual fashion. [id number], epidural anesthesia.   Sponge, instrument and needle counts were correct  x 2.  The patient and baby were stable after delivery and remained in couplet care, with plans to transfer later to postpartum unit  Baby Weight: pending  Placenta: Sent to L&D Complications: None Lacerations: first degree, repaired EBL: 200 mL Analgesia: Epidural   Infant:  APGAR (1 MIN): 1   APGAR (5 MINS): 6   APGAR (10 MINS): 8     Casper Harrison, MD [facility] Family Medicine Fellow, [facility] for [facility], [facility] 06/12/2020, 2:57 AM

## 2020-02-19 ENCOUNTER — Ambulatory Visit: Payer: 59 | Admitting: *Deleted

## 2020-02-19 DIAGNOSIS — Z3480 Encounter for supervision of other normal pregnancy, unspecified trimester: Secondary | ICD-10-CM | POA: Insufficient documentation

## 2020-02-19 NOTE — Progress Notes (Signed)
Int #[id number] used for interpreting.   PRENATAL INTAKE SUMMARY  Deborah Davidson presents today New OB Nurse Interview.  OB History    Gravida  2   Para  1   Term  1   Preterm      AB      Living  1     SAB      IAB      Ectopic      Multiple      Live Births  1          I have reviewed the patient's medical, obstetrical, social, and family histories, medications, and available lab results.  SUBJECTIVE She has no unusual complaints  OBJECTIVE Initial Intake Exam (New OB)  GENERAL APPEARANCE: Sounds well via televisit   ASSESSMENT Normal pregnancy  PLAN Prenatal care @ [facility] Pap and Labs done at [facility].

## 2020-02-25 ENCOUNTER — Other Ambulatory Visit: Payer: Self-pay

## 2020-02-25 ENCOUNTER — Encounter: Payer: Self-pay | Admitting: Obstetrics and Gynecology

## 2020-02-25 ENCOUNTER — Ambulatory Visit (INDEPENDENT_AMBULATORY_CARE_PROVIDER_SITE_OTHER): Payer: 59 | Admitting: Obstetrics and Gynecology

## 2020-02-25 VITALS — BP 123/77 | HR 84 | Wt 140.0 lb

## 2020-02-25 DIAGNOSIS — Z3A21 21 weeks gestation of pregnancy: Secondary | ICD-10-CM

## 2020-02-25 DIAGNOSIS — O09529 Supervision of elderly multigravida, unspecified trimester: Secondary | ICD-10-CM | POA: Insufficient documentation

## 2020-02-25 DIAGNOSIS — Z3482 Encounter for supervision of other normal pregnancy, second trimester: Secondary | ICD-10-CM

## 2020-02-25 DIAGNOSIS — Z789 Other specified health status: Secondary | ICD-10-CM

## 2020-02-25 DIAGNOSIS — Z3480 Encounter for supervision of other normal pregnancy, unspecified trimester: Secondary | ICD-10-CM

## 2020-02-25 DIAGNOSIS — O09522 Supervision of elderly multigravida, second trimester: Secondary | ICD-10-CM

## 2020-02-25 MED ORDER — ASPIRIN EC 81 MG PO TBEC: 81.0000 mg | DELAYED_RELEASE_TABLET | Freq: Every day | ORAL | 2 refills | Status: DC

## 2020-02-25 NOTE — Progress Notes (Signed)
   Subjective:    Deborah Davidson is a G2P1001 [redacted]w[redacted]d being seen today for her first obstetrical visit. Patient is transferring care from [facility] with records. Paient with uncomplicated first pregnancy in [place]. Her obstetrical history is significant for advanced maternal age. Patient does intend to breast feed. Pregnancy history fully reviewed.  Patient reports no complaints.  Vitals:   02/25/20 1059  BP: 123/77  Pulse: 84  Weight: 140 lb (63.5 kg)    HISTORY: OB History  Gravida Para Term Preterm AB Living  2 1 1     1   SAB IAB Ectopic Multiple Live Births          1    # Outcome Date GA Lbr Len/2nd Weight Sex Delivery Anes PTL Lv  2 Current           1 Term 11/09/08    F Vag-Spont   LIV   Past Medical History:  Diagnosis Date  . Medical history non-contributory    Past Surgical History:  Procedure Laterality Date  . NO PAST SURGERIES     History reviewed. No pertinent family history.   Exam    Uterus:  Fundal Height: 21 cm      Assessment:    Pregnancy: G2P1001 Patient Active Problem List   Diagnosis Date Noted  . AMA (advanced maternal age) multigravida 35+ 02/25/2020  . Supervision of other normal pregnancy, antepartum 02/19/2020        Plan:     Initial labs drawn. Prenatal vitamins. Problem list reviewed and updated. Genetic Screening discussed : NIPS and AFP reviewed  Ultrasound discussed; fetal survey: ordered. RX ASA provided  Follow up in 4 weeks. 50% of 30 min visit spent on counseling and coordination of care.  04/18/2020 interpreter present during the encounter   Deborah Davidson 02/25/2020

## 2020-02-25 NOTE — Patient Instructions (Signed)
 Second Trimester of Pregnancy  The second trimester of pregnancy is from week 13 through week 27. This is months 4 through 6 of pregnancy. The second trimester is often a time when you feel your best. Your body has adjusted to being pregnant, and you begin to feel better physically. During the second trimester:  Morning sickness has lessened or stopped completely.  You may have more energy.  You may have an increase in appetite. The second trimester is also a time when the unborn baby (fetus) is growing rapidly. At the end of the sixth month, the fetus may be up to 12 inches long and weigh about 1 pounds. You will likely begin to feel the baby move (quickening) between 16 and 20 weeks of pregnancy. Body changes during your second trimester Your body continues to go through many changes during your second trimester. The changes vary and generally return to normal after the baby is born. Physical changes  Your weight will continue to increase. You will notice your lower abdomen bulging out.  You may begin to get stretch marks on your hips, abdomen, and breasts.  Your breasts will continue to grow and to become tender.  Dark spots or blotches (chloasma or mask of pregnancy) may develop on your face.  A dark line from your belly button to the pubic area (linea nigra) may appear.  You may have changes in your hair. These can include thickening of your hair, rapid growth, and changes in texture. Some people also have hair loss during or after pregnancy, or hair that feels dry or thin. Health changes  You may develop headaches.  You may have heartburn.  You may develop constipation.  You may develop hemorrhoids or swollen, bulging veins (varicose veins).  Your gums may bleed and may be sensitive to brushing and flossing.  You may urinate more often because the fetus is pressing on your bladder.  You may have back pain. This is caused by: ? Weight gain. ? Pregnancy hormones  that are relaxing the joints in your pelvis. ? A shift in weight and the muscles that support your balance. Follow these instructions at home: Medicines  Follow your health care provider's instructions regarding medicine use. Specific medicines may be either safe or unsafe to take during pregnancy. Do not take any medicines unless approved by your health care provider.  Take a prenatal vitamin that contains at least 600 micrograms (mcg) of folic acid. Eating and drinking  Eat a healthy diet that includes fresh fruits and vegetables, whole grains, good sources of protein such as meat, eggs, or tofu, and low-fat dairy products.  Avoid raw meat and unpasteurized juice, milk, and cheese. These carry germs that can harm you and your baby.  You may need to take these actions to prevent or treat constipation: ? Drink enough fluid to keep your urine pale yellow. ? Eat foods that are high in fiber, such as beans, whole grains, and fresh fruits and vegetables. ? Limit foods that are high in fat and processed sugars, such as fried or sweet foods. Activity  Exercise only as directed by your health care provider. Most people can continue their usual exercise routine during pregnancy. Try to exercise for 30 minutes at least 5 days a week. Stop exercising if you develop contractions in your uterus.  Stop exercising if you develop pain or cramping in the lower abdomen or lower back.  Avoid exercising if it is very hot or humid or if you are   at a high altitude.  Avoid heavy lifting.  If you choose to, you may have sex unless your health care provider tells you not to. Relieving pain and discomfort  Wear a supportive bra to prevent discomfort from breast tenderness.  Take warm sitz baths to soothe any pain or discomfort caused by hemorrhoids. Use hemorrhoid cream if your health care provider approves.  Rest with your legs raised (elevated) if you have leg cramps or low back pain.  If you develop  varicose veins: ? Wear support hose as told by your health care provider. ? Elevate your feet for 15 minutes, 3-4 times a day. ? Limit salt in your diet. Safety  Wear your seat belt at all times when driving or riding in a car.  Talk with your health care provider if someone is verbally or physically abusive to you. Lifestyle  Do not use hot tubs, steam rooms, or saunas.  Do not douche. Do not use tampons or scented sanitary pads.  Avoid cat litter boxes and soil used by cats. These carry germs that can cause birth defects in the baby and possibly loss of the fetus by miscarriage or stillbirth.  Do not use herbal remedies, alcohol, illegal drugs, or medicines that are not approved by your health care provider. Chemicals in these products can harm your baby.  Do not use any products that contain nicotine or tobacco, such as cigarettes, e-cigarettes, and chewing tobacco. If you need help quitting, ask your health care provider. General instructions  During a routine prenatal visit, your health care provider will do a physical exam and other tests. He or she will also discuss your overall health. Keep all follow-up visits. This is important.  Ask your health care provider for a referral to a local prenatal education class.  Ask for help if you have counseling or nutritional needs during pregnancy. Your health care provider can offer advice or refer you to specialists for help with various needs. Where to find more information  American Pregnancy Association: americanpregnancy.org  American College of Obstetricians and Gynecologists: acog.org/en/Womens%20Health/Pregnancy  Office on Women's Health: womenshealth.gov/pregnancy Contact a health care provider if you have:  A headache that does not go away when you take medicine.  Vision changes or you see spots in front of your eyes.  Mild pelvic cramps, pelvic pressure, or nagging pain in the abdominal area.  Persistent nausea,  vomiting, or diarrhea.  A bad-smelling vaginal discharge or foul-smelling urine.  Pain when you urinate.  Sudden or extreme swelling of your face, hands, ankles, feet, or legs.  A fever. Get help right away if you:  Have fluid leaking from your vagina.  Have spotting or bleeding from your vagina.  Have severe abdominal cramping or pain.  Have difficulty breathing.  Have chest pain.  Have fainting spells.  Have not felt your baby move for the time period told by your health care provider.  Have new or increased pain, swelling, or redness in an arm or leg. Summary  The second trimester of pregnancy is from week 13 through week 27 (months 4 through 6).  Do not use herbal remedies, alcohol, illegal drugs, or medicines that are not approved by your health care provider. Chemicals in these products can harm your baby.  Exercise only as directed by your health care provider. Most people can continue their usual exercise routine during pregnancy.  Keep all follow-up visits. This is important. This information is not intended to replace advice given to you by   your health care provider. Make sure you discuss any questions you have with your health care provider. Document Revised: 07/11/2019 Document Reviewed: 05/17/2019 Elsevier Patient Education  2021 [employer]   Contraception Choices Contraception, also called birth control, refers to methods or devices that prevent pregnancy. Hormonal methods Contraceptive implant A contraceptive implant is a thin, plastic tube that contains a hormone that prevents pregnancy. It is different from an intrauterine device (IUD). It is inserted into the upper part of the arm by a health care provider. Implants can be effective for up to 3 years. Progestin-only injections Progestin-only injections are injections of progestin, a synthetic form of the hormone progesterone. They are given every 3 months by a health care provider. Birth control  pills Birth control pills are pills that contain hormones that prevent pregnancy. They must be taken once a day, preferably at the same time each day. A prescription is needed to use this method of contraception. Birth control patch The birth control patch contains hormones that prevent pregnancy. It is placed on the skin and must be changed once a week for three weeks and removed on the fourth week. A prescription is needed to use this method of contraception. Vaginal ring A vaginal ring contains hormones that prevent pregnancy. It is placed in the vagina for three weeks and removed on the fourth week. After that, the process is repeated with a new ring. A prescription is needed to use this method of contraception. Emergency contraceptive Emergency contraceptives prevent pregnancy after unprotected sex. They come in pill form and can be taken up to 5 days after sex. They work best the sooner they are taken after having sex. Most emergency contraceptives are available without a prescription. This method should not be used as your only form of birth control.   Barrier methods Female condom A female condom is a thin sheath that is worn over the penis during sex. Condoms keep sperm from going inside a woman's body. They can be used with a sperm-killing substance (spermicide) to increase their effectiveness. They should be thrown away after one use. Female condom A female condom is a soft, loose-fitting sheath that is put into the vagina before sex. The condom keeps sperm from going inside a woman's body. They should be thrown away after one use. Diaphragm A diaphragm is a soft, dome-shaped barrier. It is inserted into the vagina before sex, along with a spermicide. The diaphragm blocks sperm from entering the uterus, and the spermicide kills sperm. A diaphragm should be left in the vagina for 6-8 hours after sex and removed within 24 hours. A diaphragm is prescribed and fitted by a health care provider. A  diaphragm should be replaced every 1-2 years, after giving birth, after gaining more than 15 lb (6.8 kg), and after pelvic surgery. Cervical cap A cervical cap is a round, soft latex or plastic cup that fits over the cervix. It is inserted into the vagina before sex, along with spermicide. It blocks sperm from entering the uterus. The cap should be left in place for 6-8 hours after sex and removed within 48 hours. A cervical cap must be prescribed and fitted by a health care provider. It should be replaced every 2 years. Sponge A sponge is a soft, circular piece of polyurethane foam with spermicide in it. The sponge helps block sperm from entering the uterus, and the spermicide kills sperm. To use it, you make it wet and then insert it into the vagina. It should be   inserted before sex, left in for at least 6 hours after sex, and removed and thrown away within 30 hours. Spermicides Spermicides are chemicals that kill or block sperm from entering the cervix and uterus. They can come as a cream, jelly, suppository, foam, or tablet. A spermicide should be inserted into the vagina with an applicator at least 10-15 minutes before sex to allow time for it to work. The process must be repeated every time you have sex. Spermicides do not require a prescription.   Intrauterine contraception Intrauterine device (IUD) An IUD is a T-shaped device that is put in a woman's uterus. There are two types:  Hormone IUD.This type contains progestin, a synthetic form of the hormone progesterone. This type can stay in place for 3-5 years.  Copper IUD.This type is wrapped in copper wire. It can stay in place for 10 years. Permanent methods of contraception Female tubal ligation In this method, a woman's fallopian tubes are sealed, tied, or blocked during surgery to prevent eggs from traveling to the uterus. Hysteroscopic sterilization In this method, a small, flexible insert is placed into each fallopian tube. The inserts  cause scar tissue to form in the fallopian tubes and block them, so sperm cannot reach an egg. The procedure takes about 3 months to be effective. Another form of birth control must be used during those 3 months. Female sterilization This is a procedure to tie off the tubes that carry sperm (vasectomy). After the procedure, the man can still ejaculate fluid (semen). Another form of birth control must be used for 3 months after the procedure. Natural planning methods Natural family planning In this method, a couple does not have sex on days when the woman could become pregnant. Calendar method In this method, the woman keeps track of the length of each menstrual cycle, identifies the days when pregnancy can happen, and does not have sex on those days. Ovulation method In this method, a couple avoids sex during ovulation. Symptothermal method This method involves not having sex during ovulation. The woman typically checks for ovulation by watching changes in her temperature and in the consistency of cervical mucus. Post-ovulation method In this method, a couple waits to have sex until after ovulation. Where to find more information  Centers for Disease Control and Prevention: www.cdc.gov Summary  Contraception, also called birth control, refers to methods or devices that prevent pregnancy.  Hormonal methods of contraception include implants, injections, pills, patches, vaginal rings, and emergency contraceptives.  Barrier methods of contraception can include female condoms, female condoms, diaphragms, cervical caps, sponges, and spermicides.  There are two types of IUDs (intrauterine devices). An IUD can be put in a woman's uterus to prevent pregnancy for 3-5 years.  Permanent sterilization can be done through a procedure for males and females. Natural family planning methods involve nothaving sex on days when the woman could become pregnant. This information is not intended to replace advice  given to you by your health care provider. Make sure you discuss any questions you have with your health care provider. Document Revised: 07/09/2019 Document Reviewed: 07/09/2019 Elsevier Patient Education  2021 [employer] Inc.   Breastfeeding  Choosing to breastfeed is one of the best decisions you can make for yourself and your baby. A change in hormones during pregnancy causes your breasts to make breast milk in your milk-producing glands. Hormones prevent breast milk from being released before your baby is born. They also prompt milk flow after birth. Once breastfeeding has begun, thoughts of   your baby, as well as his or her sucking or crying, can stimulate the release of milk from your milk-producing glands. Benefits of breastfeeding Research shows that breastfeeding offers many health benefits for infants and mothers. It also offers a cost-free and convenient way to feed your baby. For your baby  Your first milk (colostrum) helps your baby's digestive system to function better.  Special cells in your milk (antibodies) help your baby to fight off infections.  Breastfed babies are less likely to develop asthma, allergies, obesity, or type 2 diabetes. They are also at lower risk for sudden infant death syndrome (SIDS).  Nutrients in breast milk are better able to meet your baby's needs compared to infant formula.  Breast milk improves your baby's brain development. For you  Breastfeeding helps to create a very special bond between you and your baby.  Breastfeeding is convenient. Breast milk costs nothing and is always available at the correct temperature.  Breastfeeding helps to burn calories. It helps you to lose the weight that you gained during pregnancy.  Breastfeeding makes your uterus return faster to its size before pregnancy. It also slows bleeding (lochia) after you give birth.  Breastfeeding helps to lower your risk of developing type 2 diabetes, osteoporosis, rheumatoid  arthritis, cardiovascular disease, and breast, ovarian, uterine, and endometrial cancer later in life. Breastfeeding basics Starting breastfeeding  Find a comfortable place to sit or lie down, with your neck and back well-supported.  Place a pillow or a rolled-up blanket under your baby to bring him or her to the level of your breast (if you are seated). Nursing pillows are specially designed to help support your arms and your baby while you breastfeed.  Make sure that your baby's tummy (abdomen) is facing your abdomen.  Gently massage your breast. With your fingertips, massage from the outer edges of your breast inward toward the nipple. This encourages milk flow. If your milk flows slowly, you may need to continue this action during the feeding.  Support your breast with 4 fingers underneath and your thumb above your nipple (make the letter "C" with your hand). Make sure your fingers are well away from your nipple and your baby's mouth.  Stroke your baby's lips gently with your finger or nipple.  When your baby's mouth is open wide enough, quickly bring your baby to your breast, placing your entire nipple and as much of the areola as possible into your baby's mouth. The areola is the colored area around your nipple. ? More areola should be visible above your baby's upper lip than below the lower lip. ? Your baby's lips should be opened and extended outward (flanged) to ensure an adequate, comfortable latch. ? Your baby's tongue should be between his or her lower gum and your breast.  Make sure that your baby's mouth is correctly positioned around your nipple (latched). Your baby's lips should create a seal on your breast and be turned out (everted).  It is common for your baby to suck about 2-3 minutes in order to start the flow of breast milk. Latching Teaching your baby how to latch onto your breast properly is very important. An improper latch can cause nipple pain, decreased milk  supply, and poor weight gain in your baby. Also, if your baby is not latched onto your nipple properly, he or she may swallow some air during feeding. This can make your baby fussy. Burping your baby when you switch breasts during the feeding can help to get rid   of the air. However, teaching your baby to latch on properly is still the best way to prevent fussiness from swallowing air while breastfeeding. Signs that your baby has successfully latched onto your nipple  Silent tugging or silent sucking, without causing you pain. Infant's lips should be extended outward (flanged).  Swallowing heard between every 3-4 sucks once your milk has started to flow (after your let-down milk reflex occurs).  Muscle movement above and in front of his or her ears while sucking. Signs that your baby has not successfully latched onto your nipple  Sucking sounds or smacking sounds from your baby while breastfeeding.  Nipple pain. If you think your baby has not latched on correctly, slip your finger into the corner of your baby's mouth to break the suction and place it between your baby's gums. Attempt to start breastfeeding again. Signs of successful breastfeeding Signs from your baby  Your baby will gradually decrease the number of sucks or will completely stop sucking.  Your baby will fall asleep.  Your baby's body will relax.  Your baby will retain a small amount of milk in his or her mouth.  Your baby will let go of your breast by himself or herself. Signs from you  Breasts that have increased in firmness, weight, and size 1-3 hours after feeding.  Breasts that are softer immediately after breastfeeding.  Increased milk volume, as well as a change in milk consistency and color by the fifth day of breastfeeding.  Nipples that are not sore, cracked, or bleeding. Signs that your baby is getting enough milk  Wetting at least 1-2 diapers during the first 24 hours after birth.  Wetting at least 5-6  diapers every 24 hours for the first week after birth. The urine should be clear or pale yellow by the age of 5 days.  Wetting 6-8 diapers every 24 hours as your baby continues to grow and develop.  At least 3 stools in a 24-hour period by the age of 5 days. The stool should be soft and yellow.  At least 3 stools in a 24-hour period by the age of 7 days. The stool should be seedy and yellow.  No loss of weight greater than 10% of birth weight during the first 3 days of life.  Average weight gain of 4-7 oz (113-198 g) per week after the age of 4 days.  Consistent daily weight gain by the age of 5 days, without weight loss after the age of 2 weeks. After a feeding, your baby may spit up a small amount of milk. This is normal. Breastfeeding frequency and duration Frequent feeding will help you make more milk and can prevent sore nipples and extremely full breasts (breast engorgement). Breastfeed when you feel the need to reduce the fullness of your breasts or when your baby shows signs of hunger. This is called "breastfeeding on demand." Signs that your baby is hungry include:  Increased alertness, activity, or restlessness.  Movement of the head from side to side.  Opening of the mouth when the corner of the mouth or cheek is stroked (rooting).  Increased sucking sounds, smacking lips, cooing, sighing, or squeaking.  Hand-to-mouth movements and sucking on fingers or hands.  Fussing or crying. Avoid introducing a pacifier to your baby in the first 4-6 weeks after your baby is born. After this time, you may choose to use a pacifier. Research has shown that pacifier use during the first year of a baby's life decreases the risk of   sudden infant death syndrome (SIDS). Allow your baby to feed on each breast as long as he or she wants. When your baby unlatches or falls asleep while feeding from the first breast, offer the second breast. Because newborns are often sleepy in the first few weeks of  life, you may need to awaken your baby to get him or her to feed. Breastfeeding times will vary from baby to baby. However, the following rules can serve as a guide to help you make sure that your baby is properly fed:  Newborns (babies 4 weeks of age or younger) may breastfeed every 1-3 hours.  Newborns should not go without breastfeeding for longer than 3 hours during the day or 5 hours during the night.  You should breastfeed your baby a minimum of 8 times in a 24-hour period. Breast milk pumping Pumping and storing breast milk allows you to make sure that your baby is exclusively fed your breast milk, even at times when you are unable to breastfeed. This is especially important if you go back to work while you are still breastfeeding, or if you are not able to be present during feedings. Your lactation consultant can help you find a method of pumping that works best for you and give you guidelines about how long it is safe to store breast milk.      Caring for your breasts while you breastfeed Nipples can become dry, cracked, and sore while breastfeeding. The following recommendations can help keep your breasts moisturized and healthy:  Avoid using soap on your nipples.  Wear a supportive bra designed especially for nursing. Avoid wearing underwire-style bras or extremely tight bras (sports bras).  Air-dry your nipples for 3-4 minutes after each feeding.  Use only cotton bra pads to absorb leaked breast milk. Leaking of breast milk between feedings is normal.  Use lanolin on your nipples after breastfeeding. Lanolin helps to maintain your skin's normal moisture barrier. Pure lanolin is not harmful (not toxic) to your baby. You may also hand express a few drops of breast milk and gently massage that milk into your nipples and allow the milk to air-dry. In the first few weeks after giving birth, some women experience breast engorgement. Engorgement can make your breasts feel heavy, warm,  and tender to the touch. Engorgement peaks within 3-5 days after you give birth. The following recommendations can help to ease engorgement:  Completely empty your breasts while breastfeeding or pumping. You may want to start by applying warm, moist heat (in the shower or with warm, water-soaked hand towels) just before feeding or pumping. This increases circulation and helps the milk flow. If your baby does not completely empty your breasts while breastfeeding, pump any extra milk after he or she is finished.  Apply ice packs to your breasts immediately after breastfeeding or pumping, unless this is too uncomfortable for you. To do this: ? Put ice in a plastic bag. ? Place a towel between your skin and the bag. ? Leave the ice on for 20 minutes, 2-3 times a day.  Make sure that your baby is latched on and positioned properly while breastfeeding. If engorgement persists after 48 hours of following these recommendations, contact your health care provider or a lactation consultant. Overall health care recommendations while breastfeeding  Eat 3 healthy meals and 3 snacks every day. Well-nourished mothers who are breastfeeding need an additional 450-500 calories a day. You can meet this requirement by increasing the amount of a balanced diet that   you eat.  Drink enough water to keep your urine pale yellow or clear.  Rest often, relax, and continue to take your prenatal vitamins to prevent fatigue, stress, and low vitamin and mineral levels in your body (nutrient deficiencies).  Do not use any products that contain nicotine or tobacco, such as cigarettes and e-cigarettes. Your baby may be harmed by chemicals from cigarettes that pass into breast milk and exposure to secondhand smoke. If you need help quitting, ask your health care provider.  Avoid alcohol.  Do not use illegal drugs or marijuana.  Talk with your health care provider before taking any medicines. These include over-the-counter and  prescription medicines as well as vitamins and herbal supplements. Some medicines that may be harmful to your baby can pass through breast milk.  It is possible to become pregnant while breastfeeding. If birth control is desired, ask your health care provider about options that will be safe while breastfeeding your baby. Where to find more information: [employer]: www.llli.org Contact a health care provider if:  You feel like you want to stop breastfeeding or have become frustrated with breastfeeding.  Your nipples are cracked or bleeding.  Your breasts are red, tender, or warm.  You have: ? Painful breasts or nipples. ? A swollen area on either breast. ? A fever or chills. ? Nausea or vomiting. ? Drainage other than breast milk from your nipples.  Your breasts do not become full before feedings by the fifth day after you give birth.  You feel sad and depressed.  Your baby is: ? Too sleepy to eat well. ? Having trouble sleeping. ? More than 1 week old and wetting fewer than 6 diapers in a 24-hour period. ? Not gaining weight by 5 days of age.  Your baby has fewer than 3 stools in a 24-hour period.  Your baby's skin or the white parts of his or her eyes become yellow. Get help right away if:  Your baby is overly tired (lethargic) and does not want to wake up and feed.  Your baby develops an unexplained fever. Summary  Breastfeeding offers many health benefits for infant and mothers.  Try to breastfeed your infant when he or she shows early signs of hunger.  Gently tickle or stroke your baby's lips with your finger or nipple to allow the baby to open his or her mouth. Bring the baby to your breast. Make sure that much of the areola is in your baby's mouth. Offer one side and burp the baby before you offer the other side.  Talk with your health care provider or lactation consultant if you have questions or you face problems as you breastfeed. This  information is not intended to replace advice given to you by your health care provider. Make sure you discuss any questions you have with your health care provider. Document Revised: 04/28/2017 Document Reviewed: 03/05/2016 Elsevier Patient Education  2021 Elsevier Inc.  

## 2020-02-25 NOTE — Progress Notes (Signed)
Int used for visit today QQ#[id number] got disconneted                                      VF#[id number]  Pt states first baby born in [place], no problems with that pregnancy.  Pt states pap and labwork done at previous office.

## 2020-03-11 ENCOUNTER — Ambulatory Visit: Payer: Medicaid Other | Attending: Obstetrics and Gynecology

## 2020-03-11 ENCOUNTER — Ambulatory Visit: Payer: Medicaid Other | Admitting: *Deleted

## 2020-03-11 ENCOUNTER — Encounter: Payer: Self-pay | Admitting: *Deleted

## 2020-03-11 ENCOUNTER — Other Ambulatory Visit: Payer: Self-pay | Admitting: *Deleted

## 2020-03-11 ENCOUNTER — Other Ambulatory Visit: Payer: Self-pay

## 2020-03-11 DIAGNOSIS — O09522 Supervision of elderly multigravida, second trimester: Secondary | ICD-10-CM

## 2020-03-11 DIAGNOSIS — Z3A24 24 weeks gestation of pregnancy: Secondary | ICD-10-CM

## 2020-03-11 DIAGNOSIS — Z3480 Encounter for supervision of other normal pregnancy, unspecified trimester: Secondary | ICD-10-CM | POA: Diagnosis present

## 2020-03-11 DIAGNOSIS — Z363 Encounter for antenatal screening for malformations: Secondary | ICD-10-CM | POA: Diagnosis not present

## 2020-03-24 ENCOUNTER — Encounter: Payer: 59 | Admitting: Obstetrics and Gynecology

## 2020-03-25 ENCOUNTER — Other Ambulatory Visit: Payer: Self-pay

## 2020-03-25 ENCOUNTER — Ambulatory Visit (INDEPENDENT_AMBULATORY_CARE_PROVIDER_SITE_OTHER): Payer: Medicaid Other | Admitting: Obstetrics and Gynecology

## 2020-03-25 VITALS — BP 125/83 | HR 81 | Wt 147.5 lb

## 2020-03-25 DIAGNOSIS — Z3A26 26 weeks gestation of pregnancy: Secondary | ICD-10-CM | POA: Insufficient documentation

## 2020-03-25 DIAGNOSIS — Z789 Other specified health status: Secondary | ICD-10-CM

## 2020-03-25 DIAGNOSIS — O09522 Supervision of elderly multigravida, second trimester: Secondary | ICD-10-CM

## 2020-03-25 DIAGNOSIS — Z3480 Encounter for supervision of other normal pregnancy, unspecified trimester: Secondary | ICD-10-CM

## 2020-03-25 NOTE — Progress Notes (Signed)
   PRENATAL VISIT NOTE  Subjective:  Deborah Davidson is a 40 y.o. G2P1001 at [redacted]w[redacted]d being seen today for ongoing prenatal care.  She is currently monitored for the following issues for this high-risk pregnancy and has Supervision of other normal pregnancy, antepartum; AMA (advanced maternal age) multigravida 35+; Language barrier; and [redacted] weeks gestation of pregnancy on their problem list.  Patient doing well with no acute concerns today. She reports no complaints.  Contractions: Not present. Vag. Bleeding: None.  Movement: Present. Denies leaking of fluid.    Pt had discontinued her baby ASA because she thought it was making her itch in her lower extremities or was causing a rash.  This side effect profile is unlikely.  Pt advised to restart the ASA and if rash/itch returns she can then discontinue.  The following portions of the patient's history were reviewed and updated as appropriate: allergies, current medications, past family history, past medical history, past social history, past surgical history and problem list. Problem list updated.  Objective:   Vitals:   03/25/20 0852  BP: 125/83  Pulse: 81  Weight: 147 lb 8 oz (66.9 kg)    Fetal Status: Fetal Heart Rate (bpm): 145 Fundal Height: 26 cm Movement: Present     General:  Alert, oriented and cooperative. Patient is in no acute distress.  Skin: Skin is warm and dry. No rash noted.   Cardiovascular: Normal heart rate noted  Respiratory: Normal respiratory effort, no problems with respiration noted  Abdomen: Soft, gravid, appropriate for gestational age.  Pain/Pressure: Absent     Pelvic: Cervical exam deferred        Extremities: Normal range of motion.  Edema: None  Mental Status:  Normal mood and affect. Normal behavior. Normal judgment and thought content.   Assessment and Plan:  Pregnancy: G2P1001 at [redacted]w[redacted]d  1. Supervision of other normal pregnancy, antepartum Continue routinr care  2. Multigravida of advanced maternal age  in second trimester grwoth scan scheduled 05/06/20  3. Language barrier Interpreter present  4. [redacted] weeks gestation of pregnancy   Preterm labor symptoms and general obstetric precautions including but not limited to vaginal bleeding, contractions, leaking of fluid and fetal movement were reviewed in detail with the patient.  Please refer to After Visit Summary for other counseling recommendations.   Return in about 2 weeks (around 04/08/2020) for ROB, in person, 2 hr GTT, 3rd trim labs.   Mariel Aloe, MD

## 2020-03-25 NOTE — Progress Notes (Signed)
[employer] language interpreter Lam [phone].

## 2020-03-25 NOTE — Patient Instructions (Signed)
Xt nghi?m dung n?p glucose Glucose Tolerance Test T?i sao ti ph?i lm xt nghi?m ny? Xt nghi?m dung n?p glucose (GTT) ???c th?c hi?n ?? ki?m tra xem c? th? qu v? x? l ???ng (glucose) nh? th? no. ?y l m?t trong vi xt nghi?m ???c dng ?? ch?n ?on b?nh ti?u ???ng (b?nh ?i tho ???ng). Chuyn gia ch?m [place] s?c kh?e c th? khuy?n ngh? xt nghi?m ny n?u qu v?:  C ti?n s? gia ?nh b? b?nh ti?u ????ng.  Bo ph.  B? nhi?m tru?ng ti ?i ti l?i.  T?ng c nhi?u v?t th??ng khng nhanh li?n, ??c bi?t l ? chn v bn chn.  L ph? n? v c ti?n s? sinh con r?t to ho?c ti?n s? [place] b? ch?t (thai ch?t l?u) l?p ?i l?p l?i.  C l??ng ???ng cao trong n??c ti?u ho?c trong mu: ? Trong l?n mang thai tr??c ?y. ? Sau khi b? nh?i mu c? tim, ph?u thu?t ho?c th?i gian di b? c?ng th?ng cao ??. Nh?ng g s? ???c xt nghi?m? Xt nghi?m ny ?o l??ng glucose trong mu ? cc th?i ?i?m khc nhau trong kho?ng th?i gian l 2 gi?Marland Kitchen Xt nghi?m ny cho bi?t c? th? qu v? c kh? n?ng x? l glucose nh? th? no. Lo?i m?u no ???c l?y? C?n ph?i l?y cc m?u ma?u ?? lm xe?t nghi?m na?y. Cc m?u ? th??ng ???c l?y b?ng cch lu?n m?t kim tim vo m?ch mu.   Ti c?n chu?n b? cho xt nghi?m ny nh? th? no?  Trong 3 ngy tr??c khi xt nghi?m, hy ?n u?ng bnh th??ng. ?n nhi?u th?c ph?m giu carbohydrate.  Tun th? ch? d?n c?a chuyn gia ch?m [place] s?c kh?e v?: ? H?n ch? ?n ho?c u?ng vo ngy lm xt nghi?m. Qu v? c th? ???c yu c?u khng ?n ho?c u?ng b?t c? th? g ngoi n??c (nh?n ?i) b?t ??u 8-12 gi? tr??c khi xt nghi?m. ? Thay ??i ho?c d?ng s? d?ng cc lo?i thu?c th??ng xuyn dng c?a qu v?. M?t s? thu?c c th? gy ?nh h??ng ln xt nghi?m ny. Hy cho chuyn gia ch?m [place] s?c kh?e bi?t v?:  T?t c? cc lo?i thu?c m qu v? ?ang s? d?ng, bao g?m c? vitamin, th?o d??c, thu?c nh? m?t, thu?c d?ng kem v thu?c khng k ??n.  B?t k? b?nh l v? mu no m qu v? c.  B?t k? ph?u thu?t no qu v? ?  c.  B?t k? tnh tr?ng b?nh l no qu v? c.  Qu v? c ?ang mang thai ho?c c th? c thai hay khng. ?i?u g x?y ra trong qu trnh th?c hi?n ki?m tra ny? Tr??c tin, qu v? s? ???c ?o ???ng huy?t. Ch? s? ny ???c g?i l ???ng huy?t lc ?i, v qu v? ? nh?n ?i tr??c xt nghi?m. Sau ?, qu v? s? u?ng m?t dung d?ch glucose c ch?a m?t l??ng glucose c? th?. ???ng huy?t c?a qu v? s? ???c ?o l?i 1 v 2 gi? sau khi u?ng dung d?ch ny. Xt nghi?m ny m?t 2 gi? ?? hon t?t. Qu v? s? c?n ? l?i n?i xt nghi?m trong th?i gian ny. Trong th?i gian lm xt nghi?m:  Khng ?n ho?c u?ng b?t k? th? g ngoi dung d?ch glucose. Qu v? s? ???c php u?ng n??c.  Khng t?p th? d?c.  Khng s? d?ng b?t k? s?n ph?m no c nicotine ho?c thu?c l. Nh?ng s?n ph?m ny bao g?m thu?c l  d?ng ht, [place]?c l d?ng nhai v d?ng c? ht [place]?c, ch?ng h?n nh? [place]u?c l ?i?n t?. N?u qu v? c?n gip ?? ?? cai [place]irstine?c, hy h?i chuyn gia ch?m [place] s?c kh?e. Anselmo Rod trnh xt nghi?m ny c th? khc nhau gi?a cc chuyn gia ch?m [place] s?c kh?e v cc b?nh vi?n. K?t qu? ???c thng bo nh? th? no? K?t qu? xt nghi?m c?a qu v? s? ???c thng bo d??i d?ng cc gi tr?. Cc k?t qu? ny s? ???c bo co theo miligram glucose trn decilit mu (mg/dL) ho?c milimol trn lt (mmol/L). Chuyn gia ch?m [place] s?c kh?e c?a qu v? s? so snh k?t qu? c?a qu v? v?i ph?m vi k?t qu? bnh th??ng ? ???c thi?t l?p sau khi xt nghi?m m?t nhm g?m nhi?u ng??i (ph?m vi tham chi?u). Ph?m vi tham chi?u c th? kha?c nhau ?? cc phng xt nghi?m v cc b?nh vi?n. ??i v?i xt nghi?m ny, cc ph?m vi tham chi?u ph? bi?n l:  Lc ?i: d??i 110 mg/dL (6,1 mmol/L).  1 gi? sau khi u?ng glucose: d??i 180 mg/dL (38,1 mmol/L).  2 gi? sau khi u?ng glucose: d??i 140 mg/dL (7,8 mmol/L). Cc k?t qu? c  ngh?a g? K?t qu? n?m trong ph?m vi tham chi?u ???c xem l bnh th??ng, ngh?a l m?c glucose c?a qu v? ???c ki?m sot t?t. K?t qu? cao h?n ph?m vi tham chi?u c th? ngh?a l qu  v? m?i b? c?ng th?ng, nh? do ch?n th??ng ho?c m?t tnh tr?ng ??t ng?t (c?p tnh) nh? nh?i mu c? tim ho?c ??t qu?, ho?c ngh?a l qu v? b?:  Ti?u ???ng.  H?i ch??ng [place].  Kh?i u ch?ng h?n nh? u t? bo ?a crm ho?c u t?y glucagon.  Suy th?n.  Vim t?y.  C??ng gip.  Nhi?m trng. Hy trao ??i v?i chuyn gia ch?m [place] s?c kh?e v? vi?c cc k?t qu? c?a qu v? c ngh?a l g. Hy h?i chuyn gia ch?m [place] s?c kh?e c?a qu v? Hy h?i chuyn gia ch?m [place] s?c kh?e ho?c khoa th?c hi?n xt nghi?m:  Khi no s? c k?t qu? c?a ti?  Ti s? l?y k?t qu? b?ng cch no?  Cc ph??ng n ?i?u tr? c?a ti l g?  Ti c?n lm cc xt nghi?m no khc?  Nh?ng b??c ti?p theo c?a ti l g? Tm t?t  GTT ???c th?c hi?n ?? ki?m tra ?? xem c? th? qu v? x? l glucose nh? th? no. ?y l m?t trong vi xt nghi?m ???c dng ?? ch?n ?on b?nh ti?u ???ng.  Xt nghi?m ny ?o l??ng glucose trong mu ? cc th?i ?i?m khc nhau trong kho?ng th?i gian l 2 gi?Marland Kitchen Xt nghi?m ny cho bi?t c? th? qu v? c kh? n?ng x? l glucose nh? th? no.  Hy trao ??i v?i chuyn gia ch?m [place] s?c kh?e v? vi?c cc k?t qu? c?a qu v? c ngh?a l g. Thng tin ny khng nh?m m?c ?ch thay th? cho l?i khuyn m chuyn gia ch?m [place] s?c kh?e ni v?i qu v?. Hy b?o ??m qu v? ph?i th?o lu?n b?t k? v?n ?? g m qu v? c v?i chuyn gia ch?m [place] s?c kh?e c?a qu v?. Document Revised: 11/30/2019 Document Reviewed: 11/30/2019 Elsevier Patient Education  2021 [employer].

## 2020-04-08 ENCOUNTER — Other Ambulatory Visit: Payer: Medicaid Other

## 2020-04-08 ENCOUNTER — Ambulatory Visit (INDEPENDENT_AMBULATORY_CARE_PROVIDER_SITE_OTHER): Payer: Medicaid Other | Admitting: Obstetrics and Gynecology

## 2020-04-08 ENCOUNTER — Encounter: Payer: Self-pay | Admitting: Obstetrics and Gynecology

## 2020-04-08 VITALS — BP 124/82 | HR 76 | Wt 150.0 lb

## 2020-04-08 DIAGNOSIS — Z789 Other specified health status: Secondary | ICD-10-CM

## 2020-04-08 DIAGNOSIS — O09523 Supervision of elderly multigravida, third trimester: Secondary | ICD-10-CM

## 2020-04-08 DIAGNOSIS — Z3480 Encounter for supervision of other normal pregnancy, unspecified trimester: Secondary | ICD-10-CM

## 2020-04-08 NOTE — Progress Notes (Signed)
   PRENATAL VISIT NOTE  Subjective:  Deborah Davidson is a 40 y.o. G2P1001 at [redacted]w[redacted]d being seen today for ongoing prenatal care.  She is currently monitored for the following issues for this high-risk pregnancy and has Supervision of other normal pregnancy, antepartum; AMA (advanced maternal age) multigravida 35+; Language barrier; and [redacted] weeks gestation of pregnancy on their problem list.  Patient reports no complaints.  Contractions: Not present. Vag. Bleeding: None.  Movement: Present. Denies leaking of fluid.   The following portions of the patient's history were reviewed and updated as appropriate: allergies, current medications, past family history, past medical history, past social history, past surgical history and problem list.   Objective:   Vitals:   04/08/20 0913  BP: 124/82  Pulse: 76  Weight: 150 lb (68 kg)    Fetal Status: Fetal Heart Rate (bpm): 144 Fundal Height: 28 cm Movement: Present     General:  Alert, oriented and cooperative. Patient is in no acute distress.  Skin: Skin is warm and dry. No rash noted.   Cardiovascular: Normal heart rate noted  Respiratory: Normal respiratory effort, no problems with respiration noted  Abdomen: Soft, gravid, appropriate for gestational age.  Pain/Pressure: Absent     Pelvic: Cervical exam deferred        Extremities: Normal range of motion.  Edema: None  Mental Status: Normal mood and affect. Normal behavior. Normal judgment and thought content.   Assessment and Plan:  Pregnancy: G2P1001 at [redacted]w[redacted]d 1. Supervision of other normal pregnancy, antepartum Patient is doing well without complaints Third trimester labs today Patient undecided on pediatrician Patient plans condoms for contraception  2. Multigravida of advanced maternal age in third trimester Follow up growth ultrasound  3. Language barrier [place] interpreter used during the encounter  Preterm labor symptoms and general obstetric precautions including but not  limited to vaginal bleeding, contractions, leaking of fluid and fetal movement were reviewed in detail with the patient. Please refer to After Visit Summary for other counseling recommendations.   Return in about 2 weeks (around 04/22/2020) for in person, ROB, High risk.  Future Appointments  Date Time Provider Department Center  04/08/2020  9:45 AM Alla Sloma, Gigi Gin, MD [facility] None  05/06/2020  9:45 AM [facility] NURSE [facility]  05/06/2020 10:00 AM [facility] US1 [facility]    Catalina Antigua, MD

## 2020-04-08 NOTE — Progress Notes (Signed)
ROB 28w  2hr GTT today.  Pt needs a refill on PNV's.

## 2020-04-09 LAB — GLUCOSE TOLERANCE, 2 HOURS W/ 1HR
Glucose, 1 hour: 154 mg/dL (ref 65–179)
Glucose, 2 hour: 125 mg/dL (ref 65–152)
Glucose, Fasting: 78 mg/dL (ref 65–91)

## 2020-04-09 LAB — CBC
Hematocrit: 39.1 % (ref 34.0–46.6)
Hemoglobin: 13.4 g/dL (ref 11.1–15.9)
MCH: 33.2 pg — ABNORMAL HIGH (ref 26.6–33.0)
MCHC: 34.3 g/dL (ref 31.5–35.7)
MCV: 97 fL (ref 79–97)
Platelets: 247 10*3/uL (ref 150–450)
RBC: 4.04 x10E6/uL (ref 3.77–5.28)
RDW: 13.2 % (ref 11.7–15.4)
WBC: 8.6 10*3/uL (ref 3.4–10.8)

## 2020-04-09 LAB — RPR: RPR Ser Ql: NONREACTIVE

## 2020-04-09 LAB — HIV ANTIBODY (ROUTINE TESTING W REFLEX): HIV Screen 4th Generation wRfx: NONREACTIVE

## 2020-04-22 ENCOUNTER — Encounter: Payer: Self-pay | Admitting: Obstetrics and Gynecology

## 2020-04-22 ENCOUNTER — Ambulatory Visit (INDEPENDENT_AMBULATORY_CARE_PROVIDER_SITE_OTHER): Payer: Medicaid Other | Admitting: Obstetrics and Gynecology

## 2020-04-22 ENCOUNTER — Other Ambulatory Visit: Payer: Self-pay

## 2020-04-22 VITALS — BP 128/81 | HR 77 | Wt 154.0 lb

## 2020-04-22 DIAGNOSIS — O09523 Supervision of elderly multigravida, third trimester: Secondary | ICD-10-CM

## 2020-04-22 DIAGNOSIS — Z3480 Encounter for supervision of other normal pregnancy, unspecified trimester: Secondary | ICD-10-CM

## 2020-04-22 DIAGNOSIS — Z789 Other specified health status: Secondary | ICD-10-CM

## 2020-04-22 NOTE — Progress Notes (Signed)
   PRENATAL VISIT NOTE  Subjective:  Deborah Davidson is a 40 y.o. G2P1001 at [redacted]w[redacted]d being seen today for ongoing prenatal care.  She is currently monitored for the following issues for this high-risk pregnancy and has Supervision of other normal pregnancy, antepartum; AMA (advanced maternal age) multigravida 35+; and Language barrier on their problem list.  Patient reports no complaints.  Contractions: Not present. Vag. Bleeding: None.  Movement: Present. Denies leaking of fluid.   The following portions of the patient's history were reviewed and updated as appropriate: allergies, current medications, past family history, past medical history, past social history, past surgical history and problem list.   Objective:   Vitals:   04/22/20 0858  BP: 128/81  Pulse: 77  Weight: 154 lb (69.9 kg)    Fetal Status: Fetal Heart Rate (bpm): 140 Fundal Height: 30 cm Movement: Present     General:  Alert, oriented and cooperative. Patient is in no acute distress.  Skin: Skin is warm and dry. No rash noted.   Cardiovascular: Normal heart rate noted  Respiratory: Normal respiratory effort, no problems with respiration noted  Abdomen: Soft, gravid, appropriate for gestational age.  Pain/Pressure: Absent     Pelvic: Cervical exam deferred        Extremities: Normal range of motion.     Mental Status: Normal mood and affect. Normal behavior. Normal judgment and thought content.   Assessment and Plan:  Pregnancy: G2P1001 at [redacted]w[redacted]d 1. Supervision of other normal pregnancy, antepartum Patient is doing well without complaints  2. Multigravida of advanced maternal age in third trimester Continue asa Growth ultrasound 3/22  3. Language barrier [place] interpreter used during the encounter  Preterm labor symptoms and general obstetric precautions including but not limited to vaginal bleeding, contractions, leaking of fluid and fetal movement were reviewed in detail with the patient. Please refer to  After Visit Summary for other counseling recommendations.   Return in about 2 weeks (around 05/06/2020) for in person, ROB, High risk.  Future Appointments  Date Time Provider Department Center  05/06/2020  9:45 AM [facility] NURSE [facility]  05/06/2020 10:00 AM [facility] US1 [facility]    Catalina Antigua, MD

## 2020-04-22 NOTE — Patient Instructions (Signed)
AREA PEDIATRIC/FAMILY PRACTICE PHYSICIANS  [place] ([address]) . [facility] o Chambliss, MD; Eniola, MD; Hale, MD; Hensel, MD; McDiarmid, MD; McIntyer, MD; Neal, MD; Walden, MD o [address] [place], [place] [address] o [phone] o Mon-Fri 8:30-12:30, 1:30-5:00 o Providers come to see babies at [facility] o Accepting Medicaid . [facility] o Limited providers who accept newborns: Koirala, MD; Morrow, MD; Wolters, MD o [address], [place], [place] [address] o [phone] o Mon-Fri 8:00-5:30 o Babies seen by providers at [facility] o Does NOT accept Medicaid o Please call early in hospitalization for appointment (limited availability)  . [facility] o [place], MD o [address] [place], [place] [address] o [phone] o Mon, Tue, Thur, Fri 8:30-5:00, Wed 10:00-7:00 (closed 1-2pm) o Babies seen by [facility] providers o Accepting Medicaid . [facility] o Rubin, MD o [address], [place], [place] [address] o [phone] o Mon-Fri 8:30-5:00, Sat 8:30-12:00 o Provider comes to see babies at [facility] o Accepting Medicaid o Must have been referred from current patients or contacted office prior to delivery . [facility] ([facility] o Brown, MD; Chandler, MD; Ettefagh, MD; Grant, MD; Lester, MD; McCormick, MD; McQueen, MD; Prose, MD; Simha, MD; Stanley, MD; Stryffeler, NP; Tebben, NP o [address], [place], [place] [address] o [phone] o Mon, Tue, Thur, Fri 8:30-5:30, Wed 9:30-5:30, Sat 8:30-12:30 o Babies seen by [facility] providers o Accepting Medicaid o Only accepting infants of first-time parents or siblings of current patients o Hospital discharge coordinator will make follow-up appointment . Jack Amos o [address],  [place], [place]  [address] o [phone]   Fax - [phone] . [facility] o [address], [place], [place]  [address] o Phone - [phone]   Fax - [phone] . Shilpa Gosrani o [address], [place], [place]  [address] o [phone]  [place] ([address]) . [facility] o Bates, MD; [place], MD; Cooper, Cox, MD; MD; Davis, MD; Dovico, MD; Ettefaugh, MD; Little, MD; Lowe, MD; Keiffer, MD; Melvin, MD; [place], MD; Williams, MD o [address], [place], [place] [address] o [phone] o Mon-Fri 8:30-5:00 (extended evenings Mon-Thur as needed), Sat-Sun 10:00-1:00 o Providers come to see babies at [facility] o Accepting Medicaid for families of first-time babies and families with all children in the household age 3 and under. Must register with office prior to making appointment (M-F only). . [facility] o Henson, NP; Knapp, MD; Lalonde, MD; Tysinger, PA o [address] [place], [place] [address] o [phone] o Mon-Fri 8:00-5:00 o Babies seen by providers at [facility] o Does NOT accept Medicaid/Commercial Insurance Only . [facility] ([facility]  o Artis, MD; [place], MD; Bratton, MD; Coccaro, MD; Lockett Gardner, MD; Kramer, MD; Marshall, MD; Netherton, MD; Poleto, MD; Skinner, MD o [address] [place],  [address] o [phone] o Mon-Fri 8:30-5:30, Sat (Oct.-Mar.) 9:00-1:00 o Babies seen by providers at [facility] o Accepting Medicaid  [place] ([address]) . [facility] o Reid, MD; Warner, MD o [address], [place],  [address] o [phone] o Mon-Fri 8:30-5:00, Sat 8:30-12:00 o Providers come to see babies at [facility] o Does NOT accept Medicaid . [facility] o Becker, PA; Hagler, MD; Scifres, PA; Sun, MD; Swayne, MD o [address],  [place], [place] [address] o [phone] o Mon-Fri 8:00-5:00 o Babies seen by providers at [facility] o Does NOT accept Medicaid o Only accepting babies of parents who are patients o Please call early in hospitalization for appointment (limited availability) . [facility] o Clark, MD; Frye, MD; Kelleher, MD; Mack, NP; Miller, MD; O'Keller, MD; Patterson, NP; Pudlo, MD; Puzio, MD; Thomas, MD; Tucker, MD; Twiselton, MD o [address], [place], [place] [address] o [phone] o Mon-Fri 8:00-5:00, Sat 9:00-12:00 o Providers come to see babies at [facility] o Does NOT accept Medicaid  [place] ([address]) . [facility] o Limited providers accepting new patients: Brake, NP; Wharton, PA o [address], [place], [place] [address] o [phone] o Mon-Fri 8:00-5:00 o Babies seen by providers at [facility] o Does NOT accept Medicaid o Only accepting babies of parents who are patients o Please call early in hospitalization for appointment (limited availability) . [facility] o Gay, MD; Quinlan, MD o [address] [place], [place] [address] o [phone] (press 1 to schedule appointment) o Mon-Fri 8:00-5:00 o Providers come to see babies at [facility] o Does NOT accept Medicaid . [facility] o Mazer, MD o [address] [place], [place] [address] o [phone] o Mon-Fri 8:30-5:00 (lunch 12:30-1:00), extended hours by appointment only Wed 5:00-6:30 o Babies seen by [facility] providers o Accepting Medicaid . [facility] o Banks, MD; Jordan, MD; Koberlein, MD o [address], [place], [place] [address] o [phone] o Mon-Fri 8:00-5:00 o Babies seen by [facility] providers o Does NOT accept Medicaid . [facility] o Parker, MD; Hunter, MD; Wallace, DO o [address] [place], [place]  [address] o [phone] o Mon-Fri 8:00-5:00 o Babies seen by [facility] providers o Does NOT accept Medicaid . [facility] o Brandon, PA; Brecken, PA; Christy, NP; Dees, MD; DeClaire, MD; DeWeese, MD; Hansen, NP; Mills, NP; Parrish, NP; Smoot, NP; Summer, MD; Vapne, MD o [address] [place], [place] [address] o [phone] o Mon-Fri 8:30-5:00, Sat 10:00-1:00 o Providers come to see babies at [facility] o Does NOT accept Medicaid o Free prenatal information session Tuesdays at 4:45pm . [facility] o Bouska, MD; Gordon, PA; Jeffery, PA; Weber, PA o [address] [place] [address] o [phone] o Mon-Fri 7:30-5:30 o Babies seen by [facility] providers . [facility] o [address], [place], [place]  [address] o [phone]   Fax - [phone]  [place] ([address]) . [facility] o [place]ese, MD o [address] [place], [place] [address] o [phone] o Mon-Thur 8:00-6:00 o Providers come to see babies at [facility] o Accepting Medicaid . [facility] o Anderson, NP; Badger, MD; Beal, PA; Spencer, PA o [address] [place],  [address] o [phone] o Mon-Thur 7:30-7:30, Fri 7:30-4:30 o Babies seen by [facility] providers o Accepting Medicaid . [facility] o Agbuya, MD; Klett, NP; Romgoolam, MD o [address], [place],  [address] o [phone] o Mon-Fri 8:30-5:00, Sat 8:30-12:00 o Providers come to see babies at [facility] o Accepting Medicaid o Must have "Meet & Greet" appointment at office prior to delivery . [facility] o McCord,   MD; Wallace, MD; Wood, MD o [address], [place], [place] [address] o [phone] o Mon-Wed 8:00-6:00, Thur-Fri 8:00-5:00, Sat 9:00-12:00 o Providers come to  see babies at [facility] o Does NOT accept Medicaid o Only accepting siblings of current patients . [facility]  o [address], [place], [place]  [address] o [phone]   Fax - [phone] . [facility] o [address], [place], [place]  [address] o [phone]   Fax - [phone]  [place] ([address]) . [facility] o Cirigliano, DO; Matthews, DO o [address] [place], [place] [address] o [phone] o Mon-Fri 7:00-5:00 o Babies seen by [facility] providers o Does NOT accept Medicaid . [facility] o Briscoe, MD; Howley, PA; Moreira, PA o [address], [place], [place] [address] o [phone] o Mon-Fri 8:00-5:00 o Babies seen by [facility] providers o Accepting Medicaid . [facility] o Boyd, MD; Church, PA; Jones, NP; Osborn, PA o [address], [place], [place] [address] o [phone] o Mon-Fri 8:00-5:00 o Babies seen by providers at [facility] o Accepting Medicaid  [place] ([address]) . [facility] [place] o [place], DO o [address] [place], [place] [address] o [phone] o Mon-Fri 8:00-5:00 o Babies seen by [facility] providers o Does NOT accept Medicaid o Limited availability, please call early in hospitalization to schedule follow-up . [facility] o Calderon, PA; Cummings, MD; Dillard, MD; Martin, PA; Olson, MD; VanDeven, PA o [address] [place], [place], [place] [address] o [phone] o Mon-Fri 8:30-5:00, Sat 9:00-12:00 o Babies seen by providers at [facility] o Accepting Medicaid o Please register online then schedule online or call office o [email or web address] . [facility] ([facility] o Hunter, NP; Kumar, MD; Martin Rogers, PA o [address], [place], [place] [address] o [phone] o Mon-Fri 8:00-5:00 o Babies seen by providers at [facility] o Accepting Medicaid . [facility] ([facility] o [place], MD; Kristi Fleenor, NP; West, MD o [address], [place], [place] [address] o [phone] o Mon-Fri 8:00-5:30, Sat&Sun by appointment (phones open at 8:30) o Babies seen by [facility] providers o Accepting Medicaid o Must be a first-time baby or sibling of current patient . [facility] [place]  o [address], [place], [place]  [address] o [phone]   Fax - [phone]  [place] ([address]) . [facility] o Brown, PA; Cowen, PA; Rice, MD; Helton, PA; Spry, MD o [address] [place], [place] [address] o [phone] o Mon-Thur 8:00-7:00, Fri 8:00-5:00, Sat 8:00-12:00, Sun 9:00-12:00 o Babies seen by [facility] providers o Accepting Medicaid . [facility] o Coe-Goins, MD; Marshall, MD; Pierre-Louis, MD o [address] [place] [address], [place], [place] [address] o [phone] o Mon-Thur 8:00-5:00 o Babies seen by providers at [facility] o Accepting Medicaid . [facility] o Bratton, MD; Coe-Goins, MD; Hayes, MD; Lewis, MD; List, MD; Lott, MD; Marshall, MD; Moran, MD; O'Neal, MD; Pierre-Louis, MD; Pitonzo, MD; Scholer, MD; Spangle, MD o [address] [place], [place]   [address] o [phone] o Mon-Fri 8:00-5:30, Sat (Oct.-Mar.) 9:00-1:00 o Babies seen by providers at [facility] o Accepting Medicaid o Must fill out new patient packet, available online at [email or web address] . [facility] ([facility] o Friddle, NP; Harris, NP; Kelly, NP; Logan, MD;  Melvin, PA; Poth, MD; Ramadoss, MD; Stanton, NP o [address], [place], [place] [address] o [phone] o Mon-Thur 8:00-5:30, Fri 8:00-5:00 o Babies seen by providers at [facility] o Accepting Medicaid  [place] ([address]) . [facility] o Dixon, PA; [place], MD; Pickard, MD; Tapia, PA o [address], [place], [place] [address] o [phone] o Mon-Fri 8:00-5:00 o Babies seen by providers at [facility] o Accepting Medicaid   [place] ([address]) . [facility] [place] o Masneri, DO; Meyers, MD; Nelson, PA o [address], [place], [place] [address] o [phone] o Mon-Fri 8:00-5:00 o Babies seen by providers at [facility] o Does NOT accept Medicaid o Limited appointment availability, please call early in hospitalization  . [facility] [place] o Kunedd, DO; McGowen, MD o [address], [place], [place] [address] o [phone] o Mon-Fri 8:00-5:00 o Babies seen by [facility] providers o Does NOT accept Medicaid . [facility] [place] o Cameron, MD; MacDonald, MD; Michaels, PA; Nayak, MD o [address] [place] [address], [place], [place] [address] o [phone] o Mon-Fri 8:00-5:00 o After hours clinic ([address] [place], [place] [address]) [phone] Mon-Fri 5:00-8:00, Sat 12:00-6:00, Sun 10:00-4:00 o Babies seen by [facility] providers o Accepting Medicaid . [facility] [place] o [address], [place], [place]  [address] o [phone]   Fax - [phone]  [place] ([address]) . [facility] [place] [facility] o Andy, MD o [address], [place], [place] [address] o [phone] o Mon-Fri 8:00-5:00 o Babies seen by [facility] providers o Does NOT accept Medicaid . [facility] [place] ([facility] [place]) o Eksir, MD o [address], [place], [place]  [address] o [phone] o Mon-Thur 8:00-7:00, Fri 8:00-5:00, Sat 8:00-12:00 o Babies seen by providers at [facility] o Accepting Medicaid - but does not have vaccinations in office (must be received elsewhere) o Limited availability, please call early in hospitalization  [place] ([address]) . [facility]  o Charlene Flemming, MD o [address], [place]  [address] o [phone]  Fax [phone]   

## 2020-05-06 ENCOUNTER — Encounter: Payer: Self-pay | Admitting: *Deleted

## 2020-05-06 ENCOUNTER — Ambulatory Visit: Payer: Medicaid Other | Admitting: *Deleted

## 2020-05-06 ENCOUNTER — Ambulatory Visit: Payer: Medicaid Other | Attending: Obstetrics

## 2020-05-06 ENCOUNTER — Ambulatory Visit (INDEPENDENT_AMBULATORY_CARE_PROVIDER_SITE_OTHER): Payer: Medicaid Other | Admitting: Obstetrics & Gynecology

## 2020-05-06 ENCOUNTER — Other Ambulatory Visit: Payer: Self-pay

## 2020-05-06 VITALS — BP 133/85 | HR 92 | Wt 152.2 lb

## 2020-05-06 DIAGNOSIS — Z3A32 32 weeks gestation of pregnancy: Secondary | ICD-10-CM

## 2020-05-06 DIAGNOSIS — Z362 Encounter for other antenatal screening follow-up: Secondary | ICD-10-CM | POA: Diagnosis not present

## 2020-05-06 DIAGNOSIS — Z3480 Encounter for supervision of other normal pregnancy, unspecified trimester: Secondary | ICD-10-CM | POA: Insufficient documentation

## 2020-05-06 DIAGNOSIS — Z789 Other specified health status: Secondary | ICD-10-CM

## 2020-05-06 DIAGNOSIS — O321XX0 Maternal care for breech presentation, not applicable or unspecified: Secondary | ICD-10-CM

## 2020-05-06 DIAGNOSIS — O09522 Supervision of elderly multigravida, second trimester: Secondary | ICD-10-CM | POA: Diagnosis not present

## 2020-05-06 DIAGNOSIS — O09523 Supervision of elderly multigravida, third trimester: Secondary | ICD-10-CM

## 2020-05-06 NOTE — Progress Notes (Signed)
   PRENATAL VISIT NOTE  Subjective:  Deborah Davidson is a 40 y.o. G2P1001 at [redacted]w[redacted]d being seen today for ongoing prenatal care.  She is currently monitored for the following issues for this high-risk pregnancy and has Supervision of other normal pregnancy, antepartum; AMA (advanced maternal age) multigravida 35+; and Language barrier on their problem list.  Patient reports carpal tunnel symptoms.  Contractions: Not present. Vag. Bleeding: None.  Movement: Present. Denies leaking of fluid.   The following portions of the patient's history were reviewed and updated as appropriate: allergies, current medications, past family history, past medical history, past social history, past surgical history and problem list.   Objective:   Vitals:   05/06/20 0812  BP: 133/85  Pulse: 92  Weight: 152 lb 3.2 oz (69 kg)    Fetal Status: Fetal Heart Rate (bpm): 132   Movement: Present     General:  Alert, oriented and cooperative. Patient is in no acute distress.  Skin: Skin is warm and dry. No rash noted.   Cardiovascular: Normal heart rate noted  Respiratory: Normal respiratory effort, no problems with respiration noted  Abdomen: Soft, gravid, appropriate for gestational age.  Pain/Pressure: Absent     Pelvic: Cervical exam deferred        Extremities: Normal range of motion.  Edema: None  Mental Status: Normal mood and affect. Normal behavior. Normal judgment and thought content.   Assessment and Plan:  Pregnancy: G2P1001 at [redacted]w[redacted]d 1. Supervision of other normal pregnancy, antepartum Has [place] today  2. Multigravida of advanced maternal age in third trimester Follow BP  3. Language barrier [place] interpreter AMD  Preterm labor symptoms and general obstetric precautions including but not limited to vaginal bleeding, contractions, leaking of fluid and fetal movement were reviewed in detail with the patient. Please refer to After Visit Summary for other counseling recommendations.   Return in  about 2 weeks (around 05/20/2020).  Future Appointments  Date Time Provider Department Center  05/06/2020  9:45 AM [facility] NURSE [facility]  05/06/2020 10:00 AM [facility]    Scheryl Darter, MD

## 2020-05-06 NOTE — Patient Instructions (Signed)

## 2020-05-20 ENCOUNTER — Other Ambulatory Visit: Payer: Self-pay

## 2020-05-20 ENCOUNTER — Ambulatory Visit (INDEPENDENT_AMBULATORY_CARE_PROVIDER_SITE_OTHER): Payer: Medicaid Other | Admitting: Obstetrics and Gynecology

## 2020-05-20 VITALS — BP 128/79 | HR 76 | Wt 154.0 lb

## 2020-05-20 DIAGNOSIS — O26899 Other specified pregnancy related conditions, unspecified trimester: Secondary | ICD-10-CM

## 2020-05-20 DIAGNOSIS — O09523 Supervision of elderly multigravida, third trimester: Secondary | ICD-10-CM

## 2020-05-20 DIAGNOSIS — Z3480 Encounter for supervision of other normal pregnancy, unspecified trimester: Secondary | ICD-10-CM

## 2020-05-20 DIAGNOSIS — G5600 Carpal tunnel syndrome, unspecified upper limb: Secondary | ICD-10-CM | POA: Insufficient documentation

## 2020-05-20 DIAGNOSIS — Z3A34 34 weeks gestation of pregnancy: Secondary | ICD-10-CM | POA: Insufficient documentation

## 2020-05-20 DIAGNOSIS — Z789 Other specified health status: Secondary | ICD-10-CM

## 2020-05-20 MED ORDER — M-NATAL PLUS 27-1 MG PO TABS: 1.0000 | ORAL_TABLET | Freq: Every day | ORAL | 11 refills | Status: AC

## 2020-05-20 NOTE — Progress Notes (Signed)
Int #[id number] used for today's visit.

## 2020-05-20 NOTE — Progress Notes (Signed)
Pt states she has some numbness in her hands, mostly at night.  Pt had u/s on 3/22.

## 2020-05-20 NOTE — Progress Notes (Signed)
   PRENATAL VISIT NOTE  Subjective:  Deborah Davidson is a 40 y.o. G2P1001 at [redacted]w[redacted]d being seen today for ongoing prenatal care.  She is currently monitored for the following issues for this low-risk pregnancy and has Supervision of other normal pregnancy, antepartum; AMA (advanced maternal age) multigravida 35+; Language barrier; [redacted] weeks gestation of pregnancy; and Carpal tunnel syndrome during pregnancy on their problem list.  Patient doing well with no acute concerns today. She reports carpal tunnel symptoms.  Contractions: Not present. Vag. Bleeding: None.  Movement: Present. Denies leaking of fluid.   The following portions of the patient's history were reviewed and updated as appropriate: allergies, current medications, past family history, past medical history, past social history, past surgical history and problem list. Problem list updated.  Objective:   Vitals:   05/20/20 0854  BP: 128/79  Pulse: 76  Weight: 154 lb (69.9 kg)    Fetal Status: Fetal Heart Rate (bpm): 130   Movement: Present     General:  Alert, oriented and cooperative. Patient is in no acute distress.  Skin: Skin is warm and dry. No rash noted.   Cardiovascular: Normal heart rate noted  Respiratory: Normal respiratory effort, no problems with respiration noted  Abdomen: Soft, gravid, appropriate for gestational age.  Pain/Pressure: Absent     Pelvic: Cervical exam deferred        Extremities: Normal range of motion.     Mental Status:  Normal mood and affect. Normal behavior. Normal judgment and thought content.   Assessment and Plan:  Pregnancy: G2P1001 at [redacted]w[redacted]d  1. [redacted] weeks gestation of pregnancy   2. Multigravida of advanced maternal age in third trimester   3. Language barrier Interpreter used [phone]  4. Supervision of other normal pregnancy, antepartum  - Prenatal Vit-Fe Fumarate-FA (M-NATAL PLUS) 27-1 MG TABS; Take 1 tablet by mouth daily.  Dispense: 30 tablet; Refill: 11  5. Carpal tunnel  syndrome during pregnancy Positive tinnel's sign, pt advised that symptoms should decrease once pregnancy ended.  Preterm labor symptoms and general obstetric precautions including but not limited to vaginal bleeding, contractions, leaking of fluid and fetal movement were reviewed in detail with the patient.  Please refer to After Visit Summary for other counseling recommendations.   Return in about 2 weeks (around 06/03/2020) for ROB, in person.   Mariel Aloe, MD Faculty Attending Center for [facility]

## 2020-05-20 NOTE — Patient Instructions (Signed)
Xt nghi?m lin c?u khu?n nhm B trong [place] k? Group B Streptococcus Test During Pregnancy T?i sao ti ph?i lm xt nghi?m ny? Xt nghi?m th??ng quy, cn ???c g?i l sng l?c, lin c?u khu?n nhm B (GBS) ???c khuy?n ngh? cho t?t c? cc ph? n? mang thai t? 38 tu?n ??n 37 tu?n c?a [place] k?. GBS l m?t lo?i vi khu?n c th? ly t? m? sang con trong khi sinh. Sng l?c s? gip h??ng d?n xem qu v? c c?n ?i?u tr? hay khng trong qu trnh chuy?n d? v sinh con ?? ng?n ng?a cc bi?n ch?ng nh?:  Nhi?m trng trong t? cung trong khi chuy?n d?.  Nhi?m trng trong t? cung sau khi sinh.  Nhi?m trng nghim tr?ng ? con qu v? sau khi sinh, ch?ng h?n nh? vim ph?i, vim mng no, ho?c nhi?m trng huy?t. Sng l?c GBS th??ng khng ???c th?c hi?n tr??c 38 tu?n c?a [place] k? tr? khi qu v? chuy?n d? s?m. ?i?u g x?y ra n?u ti c lin c?u khu?n nhm B? N?u xt nghi?m cho th?y qu v? c GBS, chuyn gia ch?m [place] s?c kh?e s? khuy?n ngh? ?i?u tr? b?ng thu?c khng sinh dng theo ???ng t?nh m?ch trong khi chuy?n d? v sinh con. ?i?u tr? ny lm gi?m ?ng k? nguy c? bi?n ch?ng cho qu v? v con qu v?. N?u qu v? ???c sinh m? theo k? ho?ch v qu v? c GBS, qu v? c th? khng c?n ph?i ?i?u tr? b?ng khng sinh v GBS th??ng lan sang con qu v? sau khi chuy?n d? b?t ??u v qu v? c v? ?i. N?u qu v? chuy?n d? ho?c n??c ?i v? tr??c khi m? ??, GBS c th? thm nh?p vo t? cung c?a qu v? v lan sang con qu v?, do v?y qu v? c th? c?n ?i?u tr?. C c? h?i no ?? ti khng ph?i xt nghi?m khng? Qu v? c th? khng c?n ph?i xt nghi?m GBS n?u:  Qu v? th? n??c ti?u pht hi?n GBS tr??c 36 ??n 37 tu?n.  Qu v? c con b? nhi?m GBS sau l?n sinh tr??c ?. Trong nh?ng tr??ng h?p ny, qu v? s? t? ??ng ???c ?i?u tr? GBS trong khi chuy?n d? v sinh con. Nh?ng g s? ???c xt nghi?m? Xt nghi?m ny ???c th?c hi?n ?? ki?m tra xem qu v? c lin c?u khu?n nhm B trong m ??o hay tr?c trng hay khng. Lo?i m?u no ???c l?y? ?? thu th?p  m?u cho xt nghi?m ny, chuyn gia ch?m [place] s?c kh?e s? ??a t?m bng vo m ??o v tr?c trng c?a qu v?. Sau ? m?u s? ???c g?i ??n phng th nghi?m ?? xem c GBS hay khng. ?i?u g x?y ra trong qu trnh xt nghi?m?  Qu v? s? c?i qu?n o t? th?t l?ng tr? xu?ng.  Qu v? s? n?m xu?ng bn khm theo t? th? t??ng t? nh? khm vng ch?u.  ?? thu th?p m?u cc m?u cho xt nghi?m nui c?y, chuyn gia ch?m [place] s?c kh?e s? ??a t?m bng vo m ??o v tr?c trng c?a qu v?.  Qu v? s? c th? v? nh sau khi xt nghi?m v th?c hi?n t?t c? cc sinh ho?t bnh th??ng c?a qu v?.   K?t qu? ???c thng bo nh? th? no? K?t qu? xt nghi?m c?a qu v? s? ???c thng bo l d??ng tnh ho?c m tnh. Cc k?t qu? c  ngh?a g?  M?t xt nghi?m d??ng tnh c ngh?a l qu v? c nguy c? lan GBS cho con qu v? trong qu trnh chuy?n d? v sinh con. Chuyn gia ch?m [place] s?c kh?e s? khuy?n ngh? ?i?u tr? cho qu v? b?ng thu?c khng sinh dng theo ???ng t?nh m?ch trong khi chuy?n d? v sinh con.  M?t xt nghi?m m tnh c ngh?a l qu v? c r?t t nguy c? lan GBS sang con qu v?. V?n c t nguy c? lan GBS sang con qu v? v ?i khi k?t qu? xt nghi?m c th? bo r?ng qu v? khng b? tnh tr?ng ny trong khi qu v? b? (k?t qu? m tnh gi?) ho?c qu v? c nguy c? nhi?m GBS sau khi lm xt nghi?m. Kh? n?ng l?n nh?t l qu v? s? khng c?n ph?i ?i?u tr? b?ng khng sinh trong khi chuy?n d? v sinh con. Hy trao ??i v?i chuyn gia ch?m [place] s?c kh?e v? vi?c cc k?t qu? c?a qu v? c ngh?a l g. ??a ra ca?c cu h?i v?i chuyn gia ch?m [place] s?c kh?e c?a qu v? Hy h?i chuyn gia ch?m [place] s?c kh?e ho?c khoa th?c hi?n xt nghi?m:  Khi no s? c k?t qu? c?a ti?  Ti s? l?y k?t qu? b?ng cch no?  Cc ph??ng n ?i?u tr? c?a ti l g? Tm t?t  Xt nghi?m th??ng quy (sng l?c) lin c?u khu?n nhm B (GBS) ???c khuy?n ngh? cho t?t c? cc ph? n? mang thai t? 36 tu?n ??n 37 tu?n c?a [place] k?.  GBS l m?t lo?i vi khu?n c th? lan t? m? sang  con trong khi sinh.  N?u xt nghi?m cho th?y qu v? c GBS, chuyn gia ch?m [place] s?c kh?e s? khuy?n ngh? ?i?u tr? cho qu v? b?ng thu?c khng sinh dng theo ???ng t?nh m?ch trong khi chuy?n d? v sinh con. ?i?u tr? ny g?n nh? lun ng?n ng?a nhi?m trng ? tr? s? sinh. Thng tin ny khng nh?m m?c ?ch thay th? cho l?i khuyn m chuyn gia ch?m [place] s?c kh?e ni v?i qu v?. Hy b?o ??m qu v? ph?i th?o lu?n b?t k? v?n ?? g m qu v? c v?i chuyn gia ch?m [place] s?c kh?e c?a qu v?. Document Revised: 04/06/2018 Document Reviewed: 04/06/2018 Elsevier Patient Education  2021 [employer].

## 2020-06-03 ENCOUNTER — Other Ambulatory Visit: Payer: Self-pay

## 2020-06-03 ENCOUNTER — Ambulatory Visit (INDEPENDENT_AMBULATORY_CARE_PROVIDER_SITE_OTHER): Payer: Medicaid Other

## 2020-06-03 ENCOUNTER — Other Ambulatory Visit (HOSPITAL_COMMUNITY)
Admission: RE | Admit: 2020-06-03 | Discharge: 2020-06-03 | Disposition: A | Discharge status: Home / Self Care | Payer: Medicaid Other | Source: Ambulatory Visit

## 2020-06-03 VITALS — BP 115/83 | HR 87 | Wt 158.0 lb

## 2020-06-03 DIAGNOSIS — Z3480 Encounter for supervision of other normal pregnancy, unspecified trimester: Secondary | ICD-10-CM | POA: Insufficient documentation

## 2020-06-03 DIAGNOSIS — Z3A36 36 weeks gestation of pregnancy: Secondary | ICD-10-CM | POA: Insufficient documentation

## 2020-06-03 DIAGNOSIS — Z789 Other specified health status: Secondary | ICD-10-CM

## 2020-06-03 NOTE — Progress Notes (Signed)
   LOW-RISK PREGNANCY OFFICE VISIT  Patient name: Deborah Davidson MRN [id number]  Date of birth: July 17, 1980 Chief Complaint:   Routine Prenatal Visit  Subjective:   Deborah Davidson is a 40 y.o. G73P1001 female at [redacted]w[redacted]d with an Estimated Date of Delivery: 07/01/20 being seen today for ongoing management of a low-risk pregnancy aeb has Supervision of other normal pregnancy, antepartum; AMA (advanced maternal age) multigravida 35+; Language barrier; [redacted] weeks gestation of pregnancy; and Carpal tunnel syndrome during pregnancy on their problem list.  Patient presents today with complaint of occasional contractions.  Patient endorses fetal movement. Patient denies vaginal concerns including abnormal discharge, leaking of fluid, and bleeding. She also denies concerns with urination, constipation, or diarrhea.  Contractions: Not present. Vag. Bleeding: None.  Movement: Present.  Reviewed past medical,surgical, social, obstetrical and family history as well as problem list, medications and allergies.  Objective   Vitals:   06/03/20 0835 06/03/20 0842  BP: 136/89 115/83  Pulse: 80 87  Weight: 158 lb (71.7 kg)   Body mass index is 27.99 kg/m.  Total Weight Gain:46 lb (20.9 kg)         Physical Examination:   General appearance: Well appearing, and in no distress  Mental status: Alert, oriented to person, place, and time  Skin: Warm & dry  Cardiovascular: Normal heart rate noted  Respiratory: Normal respiratory effort, no distress  Abdomen: Soft, gravid, nontender, AGA with Fundal height of Fundal Height: 32 cm  Pelvic: Cervical exam performed  Dilation: Closed Effacement (%): 50 Station: Ballotable Presentation: Undeterminable  Extremities: Edema: None  Fetal Status: Fetal Heart Rate (bpm): 134  Movement: Present   No results found for this or any previous visit (from the past 24 hour(s)).  Assessment & Plan:  Low-risk pregnancy of a 40 y.o., G2P1001 at [redacted]w[redacted]d with an Estimated Date of Delivery:  07/01/20   1. Supervision of other normal pregnancy, antepartum -Anticipatory guidance for upcoming appts. -Patient to next appt in 1 weeks for an in-person visit. -Informed that provider unable to confirm fetal presentation.  2. [redacted] weeks gestation of pregnancy -Doing well overall. -Labs as below. -Educated on GBS bacteria including what it is, why we test, and how and when we treat if needed. -Also informed that GC/CT will be collected.  -Discussed releasing of results to mychart. -Discussed and reviewed postpartum planning including contraception, pediatricians, and infant feedings and circumcision desires. *Patient desires infant circumcision nd is considering rhythm method for contraception.  3. Language barrier -Interpreter utilized: Shayne Alken [phone]     Meds: No orders of the defined types were placed in this encounter.  Labs/procedures today:  Lab Orders  No laboratory test(s) ordered today     Reviewed: Term labor symptoms and general obstetric precautions including but not limited to vaginal bleeding, contractions, leaking of fluid and fetal movement were reviewed in detail with the patient.  All questions were answered.  Follow-up: Return in about 1 week (around 06/10/2020) for LROB-Vietnamese Speaking.  No orders of the defined types were placed in this encounter.  Cherre Robins MSN, CNM 06/03/2020

## 2020-06-03 NOTE — Progress Notes (Signed)
ROB 36w  GBS Due today. Pt unsure if she wants cervix check today.   *No HA's no visual changes , no swelling  B/P repeated due to legs being crossed with first reading  * B/P is now 115/83 P: 87  CC: Dull pain in belly.

## 2020-06-04 LAB — CERVICOVAGINAL ANCILLARY ONLY
Chlamydia: NEGATIVE
Comment: NEGATIVE
Comment: NEGATIVE
Comment: NORMAL
Neisseria Gonorrhea: NEGATIVE
Trichomonas: NEGATIVE

## 2020-06-05 ENCOUNTER — Encounter (HOSPITAL_COMMUNITY): Payer: Self-pay | Admitting: Family Medicine

## 2020-06-05 ENCOUNTER — Other Ambulatory Visit: Payer: Self-pay

## 2020-06-05 ENCOUNTER — Inpatient Hospital Stay (HOSPITAL_COMMUNITY)
Admission: AD | Admit: 2020-06-05 | Discharge: 2020-06-06 | Disposition: A | Discharge status: Home / Self Care | Payer: Medicaid Other | Attending: Family Medicine | Admitting: Family Medicine

## 2020-06-05 DIAGNOSIS — Z3A36 36 weeks gestation of pregnancy: Secondary | ICD-10-CM | POA: Insufficient documentation

## 2020-06-05 DIAGNOSIS — O26893 Other specified pregnancy related conditions, third trimester: Secondary | ICD-10-CM | POA: Diagnosis not present

## 2020-06-05 DIAGNOSIS — O4703 False labor before 37 completed weeks of gestation, third trimester: Secondary | ICD-10-CM

## 2020-06-05 DIAGNOSIS — R030 Elevated blood-pressure reading, without diagnosis of hypertension: Secondary | ICD-10-CM | POA: Diagnosis not present

## 2020-06-05 DIAGNOSIS — R109 Unspecified abdominal pain: Secondary | ICD-10-CM | POA: Diagnosis present

## 2020-06-05 DIAGNOSIS — Z3480 Encounter for supervision of other normal pregnancy, unspecified trimester: Secondary | ICD-10-CM

## 2020-06-05 DIAGNOSIS — O4693 Antepartum hemorrhage, unspecified, third trimester: Secondary | ICD-10-CM | POA: Insufficient documentation

## 2020-06-05 LAB — CBC
HCT: 39.2 % (ref 36.0–46.0)
Hemoglobin: 13.4 g/dL (ref 12.0–15.0)
MCH: 32.9 pg (ref 26.0–34.0)
MCHC: 34.2 g/dL (ref 30.0–36.0)
MCV: 96.3 fL (ref 80.0–100.0)
Platelets: 238 10*3/uL (ref 150–400)
RBC: 4.07 MIL/uL (ref 3.87–5.11)
RDW: 12.8 % (ref 11.5–15.5)
WBC: 10.2 10*3/uL (ref 4.0–10.5)
nRBC: 0 % (ref 0.0–0.2)

## 2020-06-05 LAB — URINALYSIS, ROUTINE W REFLEX MICROSCOPIC
Bacteria, UA: NONE SEEN
Bilirubin Urine: NEGATIVE
Glucose, UA: NEGATIVE mg/dL
Hgb urine dipstick: NEGATIVE
Ketones, ur: NEGATIVE mg/dL
Nitrite: NEGATIVE
Protein, ur: NEGATIVE mg/dL
Specific Gravity, Urine: 1.006 (ref 1.005–1.030)
pH: 7 (ref 5.0–8.0)

## 2020-06-05 LAB — COMPREHENSIVE METABOLIC PANEL
ALT: 11 U/L (ref 0–44)
AST: 24 U/L (ref 15–41)
Albumin: 3 g/dL — ABNORMAL LOW (ref 3.5–5.0)
Alkaline Phosphatase: 70 U/L (ref 38–126)
Anion gap: 9 (ref 5–15)
BUN: 7 mg/dL (ref 6–20)
CO2: 21 mmol/L — ABNORMAL LOW (ref 22–32)
Calcium: 8.9 mg/dL (ref 8.9–10.3)
Chloride: 105 mmol/L (ref 98–111)
Creatinine, Ser: 0.57 mg/dL (ref 0.44–1.00)
GFR, Estimated: >60 mL/min (ref >60)
Glucose, Bld: 79 mg/dL (ref 70–99)
Potassium: 3.9 mmol/L (ref 3.5–5.1)
Sodium: 135 mmol/L (ref 135–145)
Total Bilirubin: 0.6 mg/dL (ref 0.3–1.2)
Total Protein: 6.3 g/dL — ABNORMAL LOW (ref 6.5–8.1)

## 2020-06-05 LAB — STREP GP B NAA: Strep Gp B NAA: NEGATIVE

## 2020-06-05 MED ORDER — NALBUPHINE HCL 10 MG/ML IJ SOLN: 5.0000 mg | Freq: Once | INTRAMUSCULAR | Status: DC

## 2020-06-05 MED ORDER — NALBUPHINE HCL 10 MG/ML IJ SOLN
10.0000 mg | Freq: Once | INTRAMUSCULAR | Status: AC
  Administered 2020-06-05: 10 mg via INTRAVENOUS
  Filled 2020-06-05: qty 1

## 2020-06-05 MED ORDER — LACTATED RINGERS IV BOLUS
1000.0000 mL | Freq: Once | INTRAVENOUS | Status: AC
  Administered 2020-06-05: 1000 mL via INTRAVENOUS

## 2020-06-05 NOTE — MAU Provider Note (Signed)
Chief Complaint:  Contractions   Event Date/Time   First Provider Initiated Contact with Patient 06/05/20 2205      HPI: Deborah Davidson is a 40 y.o. G2P1001 at [redacted]w[redacted]d who presents to maternity admissions reporting onset of abdominal pain that lasted for 3 hours this afternoon then resolved. The pain was low in her abdomen, bilaterally but sometimes more on the left lower side, intermittent, and staying the same in intensity x 3 hours then resolving without treatment. She then had pelvic pressure like she needed to have a bowel movement in the shower tonight so she came in for evaluation. She is feeling normal fetal movement. There are no other symptoms.    HPI  Past Medical History: Past Medical History:  Diagnosis Date  . Medical history non-contributory     Past obstetric history: OB History  Gravida Para Term Preterm AB Living  2 1 1     1   SAB IAB Ectopic Multiple Live Births          1    # Outcome Date GA Lbr Len/2nd Weight Sex Delivery Anes PTL Lv  2 Current           1 Term 11/09/08    F Vag-Spont   LIV    Past Surgical History: Past Surgical History:  Procedure Laterality Date  . NO PAST SURGERIES      Family History: Family History  Problem Relation Age of Onset  . Cancer Neg Hx   . Diabetes Neg Hx   . Heart disease Neg Hx   . Hypertension Neg Hx     Social History: Social History   Tobacco Use  . Smoking status: Never Smoker  . Smokeless tobacco: Never Used  Vaping Use  . Vaping Use: Never used  Substance Use Topics  . Alcohol use: Never  . Drug use: Never    Allergies:  Allergies  Allergen Reactions  . Milk-Related Compounds     Meds:  No medications prior to admission.    ROS:  Review of Systems  Constitutional: Negative for chills, fatigue and fever.  Eyes: Negative for visual disturbance.  Respiratory: Negative for shortness of breath.   Cardiovascular: Negative for chest pain.  Gastrointestinal: Negative for abdominal pain, nausea  and vomiting.  Genitourinary: Negative for difficulty urinating, dysuria, flank pain, pelvic pain, vaginal bleeding, vaginal discharge and vaginal pain.  Neurological: Negative for dizziness and headaches.  Psychiatric/Behavioral: Negative.      I have reviewed patient's Past Medical Hx, Surgical Hx, Family Hx, Social Hx, medications and allergies.   Physical Exam   Patient Vitals for the past 24 hrs:  BP Temp Pulse Resp Height Weight  06/06/20 0302 137/86 -- 79 18 -- --  06/06/20 0231 136/84 -- 79 -- -- --  06/06/20 0201 138/87 -- 78 -- -- --  06/06/20 0146 139/90 -- 79 -- -- --  06/06/20 0131 140/90 -- 83 -- -- --  06/06/20 0116 (!) 158/94 -- (!) 101 -- -- --  06/06/20 0101 (!) 141/83 -- 81 -- -- --  06/06/20 0046 133/88 -- 79 -- -- --  06/06/20 0031 (!) 143/85 -- 77 -- -- --  06/06/20 0016 135/80 -- 84 -- -- --  06/06/20 0001 (!) 141/83 -- 78 -- -- --  06/05/20 2346 133/90 -- 74 -- -- --  06/05/20 2331 139/86 -- 81 -- -- --  06/05/20 2316 (!) 145/77 -- 86 -- -- --  06/05/20 2309 139/85 -- 78 -- -- --  06/05/20 2231 (!) 135/91 -- 73 -- -- --  06/05/20 2210 131/89 -- 76 -- -- --  06/05/20 2044 (!) 134/94 98 F (36.7 C) 80 16 -- --  06/05/20 2013 (!) 134/96 -- 77 -- -- --  06/05/20 2012 -- 98.2 F (36.8 C) -- 16 5\' 3"  (1.6 m) 72.1 kg   Constitutional: Well-developed, well-nourished female in no acute distress.  Cardiovascular: normal rate Respiratory: normal effort GI: Abd soft, non-tender, gravid appropriate for gestational age.  MS: Extremities nontender, no edema, normal ROM Neurologic: Alert and oriented x 4.  GU: Neg CVAT.  PELVIC EXAM: Cervix pink, visually closed, without lesion, scant white creamy discharge, vaginal walls and external genitalia normal Bimanual exam: Cervix 0/long/high, firm, anterior, neg CMT, uterus nontender, nonenlarged, adnexa without tenderness, enlargement, or mass  Dilation: 1 Effacement (%): 50 Station: -2 Exam by:: L. Leftwich-Kirby  CNM  FHT:  Baseline 125 , moderate variability, accelerations present, no decelerations Contractions: Initially Q 2-3 minutes, then Q 8-10 after IV fluids    Labs: Results for orders placed or performed during the hospital encounter of 06/05/20 (from the past 24 hour(s))  Urinalysis, Routine w reflex microscopic Urine, Clean Catch     Status: Abnormal   Collection Time: 06/05/20  8:18 PM  Result Value Ref Range   Color, Urine STRAW (A) YELLOW   APPearance CLEAR CLEAR   Specific Gravity, Urine 1.006 1.005 - 1.030   pH 7.0 5.0 - 8.0   Glucose, UA NEGATIVE NEGATIVE mg/dL   Hgb urine dipstick NEGATIVE NEGATIVE   Bilirubin Urine NEGATIVE NEGATIVE   Ketones, ur NEGATIVE NEGATIVE mg/dL   Protein, ur NEGATIVE NEGATIVE mg/dL   Nitrite NEGATIVE NEGATIVE   Leukocytes,Ua TRACE (A) NEGATIVE   RBC / HPF 0-5 0 - 5 RBC/hpf   WBC, UA 0-5 0 - 5 WBC/hpf   Bacteria, UA NONE SEEN NONE SEEN   Squamous Epithelial / LPF 0-5 0 - 5  CBC     Status: None   Collection Time: 06/05/20 10:51 PM  Result Value Ref Range   WBC 10.2 4.0 - 10.5 K/uL   RBC 4.07 3.87 - 5.11 MIL/uL   Hemoglobin 13.4 12.0 - 15.0 g/dL   HCT 06/07/20 54.2 - 70.6 %   MCV 96.3 80.0 - 100.0 fL   MCH 32.9 26.0 - 34.0 pg   MCHC 34.2 30.0 - 36.0 g/dL   RDW 23.7 62.8 - 31.5 %   Platelets 238 150 - 400 K/uL   nRBC 0.0 0.0 - 0.2 %  Comprehensive metabolic panel     Status: Abnormal   Collection Time: 06/05/20 10:51 PM  Result Value Ref Range   Sodium 135 135 - 145 mmol/L   Potassium 3.9 3.5 - 5.1 mmol/L   Chloride 105 98 - 111 mmol/L   CO2 21 (L) 22 - 32 mmol/L   Glucose, Bld 79 70 - 99 mg/dL   BUN 7 6 - 20 mg/dL   Creatinine, Ser 06/07/20 0.44 - 1.00 mg/dL   Calcium 8.9 8.9 - 1.60 mg/dL   Total Protein 6.3 (L) 6.5 - 8.1 g/dL   Albumin 3.0 (L) 3.5 - 5.0 g/dL   AST 24 15 - 41 U/L   ALT 11 0 - 44 U/L   Alkaline Phosphatase 70 38 - 126 U/L   Total Bilirubin 0.6 0.3 - 1.2 mg/dL   GFR, Estimated 73.7 >10 mL/min   Anion gap 9 5 - 15  Protein  / creatinine ratio, urine  Status: Abnormal   Collection Time: 06/05/20 11:55 PM  Result Value Ref Range   Creatinine, Urine 32.14 mg/dL   Total Protein, Urine 9 mg/dL   Protein Creatinine Ratio 0.28 (H) 0.00 - 0.15 mg/mg[Cre]   A/Positive/-- (10/19 0000)  Imaging:  No results found.  MAU Course/MDM: Orders Placed This Encounter  Procedures  . Urinalysis, Routine w reflex microscopic Urine, Clean Catch  . CBC  . Comprehensive metabolic panel  . Protein / creatinine ratio, urine  . Discharge patient    Meds ordered this encounter  Medications  . DISCONTD: nalbuphine (NUBAIN) injection 5 mg  . lactated ringers bolus 1,000 mL  . nalbuphine (NUBAIN) injection 10 mg     NST reviewed and reactive Pt initially evaluated for labor check but BP was elevated. Pt reports she thinks it is due to pain.  No Hx HTN.   IV fluids given, Nubain given for therapeutic rest PEC labs wnl, no s/sx of PEC, BP elevated for the first time today Cervix unchanged in 2+ hours in MAU but significant dark brown blood on glove following exam  Consult Dr Mirna Mires with presentation, exam findings and test results.  Observed for additional 2 hours and bleeding minimal on pad, pt denies cramping/contraction pain.  D/C home with BP check on Monday, message sent to [facility]  Pt discharge with strict labor and PEC precautions.    Assessment: 1. Threatened preterm labor, third trimester   2. Supervision of other normal pregnancy, antepartum   3. Vaginal bleeding in pregnancy, third trimester   4. [redacted] weeks gestation of pregnancy   5. Elevated blood pressure reading without diagnosis of hypertension     Plan: Discharge home Labor precautions and fetal kick counts  Follow-up Information    [facility] Follow up.   Why: The office will call you with appointment on Monday.  Contact information: [address] [place] [address] [phone]       [facility] Follow up.   Specialty: Obstetrics and Gynecology Why: Return as needed Contact information: [address] [id number] [place] [address] [phone]             Allergies as of 06/06/2020      Reactions   Milk-related Compounds       Medication List    STOP taking these medications   aspirin EC 81 MG tablet   doxylamine (Sleep) 25 MG tablet Commonly known as: UNISOM   pyridOXINE 100 MG tablet Commonly known as: VITAMIN B-6     TAKE these medications   M-Natal Plus 27-1 MG Tabs Take 1 tablet by mouth daily.       Sharen Counter Certified Nurse-Midwife 06/06/2020 6:36 AM

## 2020-06-05 NOTE — MAU Note (Signed)
Having abd pain for 2-3 hours. Thinks may be contractions. Denies VB or LOF.

## 2020-06-06 LAB — PROTEIN / CREATININE RATIO, URINE
Creatinine, Urine: 32.14 mg/dL
Protein Creatinine Ratio: 0.28 mg/mg{Cre} — ABNORMAL HIGH (ref 0.00–0.15)
Total Protein, Urine: 9 mg/dL

## 2020-06-06 NOTE — Discharge Instructions (Signed)
Reasons to return to MAU at [facility]:  1.  Contractions are  5 minutes apart or less, each last 1 minute, these have been going on for 1-2 hours, and you cannot walk or talk during them 2.  You have a large gush of fluid, or a trickle of fluid that will not stop and you have to wear a pad 3.  You have bleeding that is bright red, heavier than spotting--like menstrual bleeding (spotting can be normal in early labor or after a check of your cervix) 4.  You do not feel the baby moving like he/she normally does  

## 2020-06-10 ENCOUNTER — Inpatient Hospital Stay (HOSPITAL_COMMUNITY)
Admission: AD | Admit: 2020-06-10 | Discharge: 2020-06-14 | DRG: 807 | Disposition: A | Discharge status: Home / Self Care | Payer: Medicaid Other | Attending: Obstetrics & Gynecology | Admitting: Obstetrics & Gynecology

## 2020-06-10 ENCOUNTER — Encounter (HOSPITAL_COMMUNITY): Payer: Self-pay | Admitting: Family Medicine

## 2020-06-10 ENCOUNTER — Encounter: Payer: Self-pay | Admitting: Obstetrics and Gynecology

## 2020-06-10 ENCOUNTER — Ambulatory Visit (INDEPENDENT_AMBULATORY_CARE_PROVIDER_SITE_OTHER): Payer: Medicaid Other | Admitting: Obstetrics and Gynecology

## 2020-06-10 ENCOUNTER — Other Ambulatory Visit: Payer: Self-pay

## 2020-06-10 VITALS — BP 152/99 | HR 86 | Wt 163.0 lb

## 2020-06-10 DIAGNOSIS — O134 Gestational [pregnancy-induced] hypertension without significant proteinuria, complicating childbirth: Secondary | ICD-10-CM | POA: Diagnosis present

## 2020-06-10 DIAGNOSIS — O26899 Other specified pregnancy related conditions, unspecified trimester: Secondary | ICD-10-CM

## 2020-06-10 DIAGNOSIS — Z23 Encounter for immunization: Secondary | ICD-10-CM | POA: Diagnosis not present

## 2020-06-10 DIAGNOSIS — Z20822 Contact with and (suspected) exposure to covid-19: Secondary | ICD-10-CM | POA: Diagnosis present

## 2020-06-10 DIAGNOSIS — O09523 Supervision of elderly multigravida, third trimester: Secondary | ICD-10-CM

## 2020-06-10 DIAGNOSIS — Z603 Acculturation difficulty: Secondary | ICD-10-CM | POA: Diagnosis present

## 2020-06-10 DIAGNOSIS — O09529 Supervision of elderly multigravida, unspecified trimester: Secondary | ICD-10-CM

## 2020-06-10 DIAGNOSIS — O1404 Mild to moderate pre-eclampsia, complicating childbirth: Secondary | ICD-10-CM | POA: Diagnosis present

## 2020-06-10 DIAGNOSIS — Z3480 Encounter for supervision of other normal pregnancy, unspecified trimester: Secondary | ICD-10-CM

## 2020-06-10 DIAGNOSIS — Z789 Other specified health status: Secondary | ICD-10-CM | POA: Diagnosis present

## 2020-06-10 DIAGNOSIS — Z3A37 37 weeks gestation of pregnancy: Secondary | ICD-10-CM

## 2020-06-10 DIAGNOSIS — O0990 Supervision of high risk pregnancy, unspecified, unspecified trimester: Secondary | ICD-10-CM | POA: Insufficient documentation

## 2020-06-10 DIAGNOSIS — G5600 Carpal tunnel syndrome, unspecified upper limb: Secondary | ICD-10-CM

## 2020-06-10 DIAGNOSIS — O1403 Mild to moderate pre-eclampsia, third trimester: Secondary | ICD-10-CM | POA: Diagnosis present

## 2020-06-10 LAB — COMPREHENSIVE METABOLIC PANEL
ALT: 12 U/L (ref 0–44)
AST: 20 U/L (ref 15–41)
Albumin: 3 g/dL — ABNORMAL LOW (ref 3.5–5.0)
Alkaline Phosphatase: 73 U/L (ref 38–126)
Anion gap: 9 (ref 5–15)
BUN: 9 mg/dL (ref 6–20)
CO2: 21 mmol/L — ABNORMAL LOW (ref 22–32)
Calcium: 9 mg/dL (ref 8.9–10.3)
Chloride: 104 mmol/L (ref 98–111)
Creatinine, Ser: 0.53 mg/dL (ref 0.44–1.00)
GFR, Estimated: >60 mL/min (ref >60)
Glucose, Bld: 117 mg/dL — ABNORMAL HIGH (ref 70–99)
Potassium: 3.8 mmol/L (ref 3.5–5.1)
Sodium: 134 mmol/L — ABNORMAL LOW (ref 135–145)
Total Bilirubin: 0.4 mg/dL (ref 0.3–1.2)
Total Protein: 6.4 g/dL — ABNORMAL LOW (ref 6.5–8.1)

## 2020-06-10 LAB — PROTEIN / CREATININE RATIO, URINE
Creatinine, Urine: 34.73 mg/dL
Protein Creatinine Ratio: 0.4 mg/mg{Cre} — ABNORMAL HIGH (ref 0.00–0.15)
Total Protein, Urine: 14 mg/dL

## 2020-06-10 LAB — CBC
HCT: 39.6 % (ref 36.0–46.0)
Hemoglobin: 13.7 g/dL (ref 12.0–15.0)
MCH: 33.3 pg (ref 26.0–34.0)
MCHC: 34.6 g/dL (ref 30.0–36.0)
MCV: 96.4 fL (ref 80.0–100.0)
Platelets: 247 10*3/uL (ref 150–400)
RBC: 4.11 MIL/uL (ref 3.87–5.11)
RDW: 12.8 % (ref 11.5–15.5)
WBC: 9.2 10*3/uL (ref 4.0–10.5)
nRBC: 0 % (ref 0.0–0.2)

## 2020-06-10 LAB — TYPE AND SCREEN
ABO/RH(D): A POS
Antibody Screen: NEGATIVE

## 2020-06-10 LAB — RESP PANEL BY RT-PCR (FLU A&B, COVID) ARPGX2
Influenza A by PCR: NEGATIVE
Influenza B by PCR: NEGATIVE
SARS Coronavirus 2 by RT PCR: NEGATIVE

## 2020-06-10 MED ORDER — OXYCODONE-ACETAMINOPHEN 5-325 MG PO TABS: 2.0000 | ORAL_TABLET | ORAL | Status: DC | PRN

## 2020-06-10 MED ORDER — LACTATED RINGERS IV SOLN
500.0000 mL | INTRAVENOUS | Status: DC | PRN
  Administered 2020-06-11: 500 mL via INTRAVENOUS

## 2020-06-10 MED ORDER — MISOPROSTOL 50MCG HALF TABLET
50.0000 ug | ORAL_TABLET | Freq: Once | ORAL | Status: AC
  Administered 2020-06-10: 50 ug via BUCCAL
  Filled 2020-06-10: qty 1

## 2020-06-10 MED ORDER — OXYTOCIN BOLUS FROM INFUSION
333.0000 mL | Freq: Once | INTRAVENOUS | Status: AC
  Administered 2020-06-12: 333 mL via INTRAVENOUS

## 2020-06-10 MED ORDER — SOD CITRATE-CITRIC ACID 500-334 MG/5ML PO SOLN: 30.0000 mL | ORAL | Status: DC | PRN

## 2020-06-10 MED ORDER — LACTATED RINGERS IV SOLN: INTRAVENOUS | Status: DC

## 2020-06-10 MED ORDER — OXYCODONE-ACETAMINOPHEN 5-325 MG PO TABS: 1.0000 | ORAL_TABLET | ORAL | Status: DC | PRN

## 2020-06-10 MED ORDER — OXYTOCIN-SODIUM CHLORIDE 30-0.9 UT/500ML-% IV SOLN
2.5000 [IU]/h | INTRAVENOUS | Status: DC
  Filled 2020-06-10: qty 500

## 2020-06-10 MED ORDER — TERBUTALINE SULFATE 1 MG/ML IJ SOLN: 0.2500 mg | Freq: Once | INTRAMUSCULAR | Status: DC | PRN

## 2020-06-10 MED ORDER — ACETAMINOPHEN 325 MG PO TABS: 650.0000 mg | ORAL_TABLET | ORAL | Status: DC | PRN

## 2020-06-10 MED ORDER — LIDOCAINE HCL (PF) 1 % IJ SOLN: 30.0000 mL | INTRAMUSCULAR | Status: DC | PRN

## 2020-06-10 MED ORDER — ONDANSETRON HCL 4 MG/2ML IJ SOLN
4.0000 mg | Freq: Four times a day (QID) | INTRAMUSCULAR | Status: DC | PRN
  Administered 2020-06-11 – 2020-06-12 (×2): 4 mg via INTRAVENOUS
  Filled 2020-06-10 (×2): qty 2

## 2020-06-10 NOTE — Progress Notes (Signed)
Labor Progress Note Deborah Davidson is a 40 y.o. G2P1001 at [redacted]w[redacted]d presented for IOL for gHTN S: Feeling contractions but mild   O:  BP 139/87   Pulse 80   Temp 98.5 F (36.9 C) (Oral)   Resp 17   Ht 5' 4.17" (1.63 m)   Wt 72.4 kg   LMP 09/25/2019   BMI 27.26 kg/m  EFM: baseline 130/mod variability/pos accels/no decels   CVE: Dilation: 1.5 Effacement (%): 50 Station: -2 Presentation: Vertex (verified by bedside ultrasound by Dr. Barb Merino) Exam by:: Dr. Barb Merino   A&P: 40 y.o. G2P1001 [redacted]w[redacted]d here for IOL for PEC w/o SF #Labor: s/p cytotec x1, FB placed with this exam. Will redose cytotec and reevaluate in four hours.   #PEC w/o SF: Based on mild range pressures and UPC .40. PEC labs normal. Asymptomatic. Continues to have MR pressures. Will monitor. #Pain: per patient request #FWB: cat I  #GBS negative    [name], MD 10:36 PM

## 2020-06-10 NOTE — H&P (Signed)
OBSTETRIC ADMISSION HISTORY AND PHYSICAL  Deborah Davidson is a 40 y.o. female G2P1001 with IUP at [redacted]w[redacted]d by L/24 presenting for induction of labor secondary to gHTN. She reports +FMs, No LOF, no VB, no blurry vision, headaches or peripheral edema, and RUQ pain.  She plans on breast and formula feeding. She requests natural family planning for birth control. She received her prenatal care at [facility]   Dating: By L/24 --->  Estimated Date of Delivery: 07/01/20  Sono:  @[redacted]w[redacted]d , CWD, normal anatomy, breech presentation, 1784g, 25% EFW  Prenatal History/Complications:  - Language barrier - gHTN  Past Medical History: Past Medical History:  Diagnosis Date  . Medical history non-contributory     Past Surgical History: Past Surgical History:  Procedure Laterality Date  . NO PAST SURGERIES      Obstetrical History: OB History    Gravida  2   Para  1   Term  1   Preterm      AB      Living  1     SAB      IAB      Ectopic      Multiple      Live Births  1           Social History Social History   Socioeconomic History  . Marital status: Married    Spouse name: Not on file  . Number of children: Not on file  . Years of education: Not on file  . Highest education level: Not on file  Occupational History  . Not on file  Tobacco Use  . Smoking status: Never Smoker  . Smokeless tobacco: Never Used  Vaping Use  . Vaping Use: Never used  Substance and Sexual Activity  . Alcohol use: Never  . Drug use: Never  . Sexual activity: Yes    Birth control/protection: None  Other Topics Concern  . Not on file  Social History Narrative  . Not on file   Social Determinants of Health   Financial Resource Strain: Not on file  Food Insecurity: Not on file  Transportation Needs: Not on file  Physical Activity: Not on file  Stress: Not on file  Social Connections: Not on file    Family History: Family History  Problem Relation Age of Onset  . Cancer Neg Hx   .  Diabetes Neg Hx   . Heart disease Neg Hx   . Hypertension Neg Hx     Allergies: Allergies  Allergen Reactions  . Milk-Related Compounds     Medications Prior to Admission  Medication Sig Dispense Refill Last Dose  . aspirin EC 81 MG tablet Take 81 mg by mouth daily. Swallow whole.     . Prenatal Vit-Fe Fumarate-FA (M-NATAL PLUS) 27-1 MG TABS Take 1 tablet by mouth daily. 30 tablet 11     Review of Systems   All systems reviewed and negative except as stated in HPI  Last menstrual period 09/25/2019. General appearance: alert, cooperative and appears stated age Lungs: normal WOB Heart: regular rate  Abdomen: soft, non-tender Extremities: no sign of DVT Presentation: cephalic on bedside ultrasound Fetal monitoringBaseline: 125 bpm, Variability: Good {> 6 bpm), Accelerations: Reactive and Decelerations: Absent Uterine activity: None    Prenatal labs: ABO, Rh: A/Positive/-- (10/19 0000) Antibody: Negative (10/19 0000) Rubella: Immune (10/19 0000) RPR: Non Reactive (02/22 1010)  HBsAg: Negative (10/19 0000)  HIV: Non Reactive (02/22 1010)  GBS: Negative/-- (04/19 0915)  2 hr Glucola wnl Genetic  screening  wnl Anatomy US wnl  Prenatal Transfer Tool  Maternal Diabetes: No Genetic Screening: Normal Maternal Ultrasounds/Referrals: Normal Fetal Ultrasounds or other Referrals:  None Maternal Substance Abuse:  No Significant Maternal Medications:  None Significant Maternal Lab Results: Group B Strep negative  No results found for this or any previous visit (from the past 24 hour(s)).  Patient Active Problem List   Diagnosis Date Noted  . Supervision of high risk pregnancy, antepartum 06/10/2020  . [redacted] weeks gestation of pregnancy 05/20/2020  . Carpal tunnel syndrome during pregnancy 05/20/2020  . AMA (advanced maternal age) multigravida 35+ 02/25/2020  . Language barrier 02/25/2020  . Supervision of other normal pregnancy, antepartum 02/19/2020    Assessment/Plan:   Obie Silos is a 40 y.o. G2P1001 at [redacted]w[redacted]d here for induction of labor secondary to gHTN.  #Labor: Given initial cervical exam will start with buccal cytotec. #Pain: Pt desires epidural in labor. #FWB: Category 1 strip #ID: GBS negative #MOF: breast & formula #MOC: natural family planning #Circ: desired #gHTN: Blood pressure mild range on admission. Pt without signs/symptoms of preeclampsia. Will f/u preeclampsia labs on admission.  Sheila Oats, MD OB Fellow, Faculty Practice 06/10/2020 5:00 PM

## 2020-06-10 NOTE — Progress Notes (Signed)
   PRENATAL VISIT NOTE  Subjective:  Deborah Davidson is a 40 y.o. G2P1001 at [redacted]w[redacted]d being seen today for ongoing prenatal care.  She is currently monitored for the following issues for this high-risk pregnancy and has Supervision of other normal pregnancy, antepartum; AMA (advanced maternal age) multigravida 35+; Language barrier; [redacted] weeks gestation of pregnancy; and Carpal tunnel syndrome during pregnancy on their problem list.  Patient reports no complaints.  Contractions: Not present. Vag. Bleeding: None.  Movement: Present. Denies leaking of fluid.   The following portions of the patient's history were reviewed and updated as appropriate: allergies, current medications, past family history, past medical history, past social history, past surgical history and problem list.   Objective:   Vitals:   06/10/20 1536 06/10/20 1538  BP: (!) 159/97 (!) 152/99  Pulse: 86   Weight: 163 lb (73.9 kg)     Fetal Status: Fetal Heart Rate (bpm): 145   Movement: Present     General:  Alert, oriented and cooperative. Patient is in no acute distress.  Skin: Skin is warm and dry. No rash noted.   Cardiovascular: Normal heart rate noted  Respiratory: Normal respiratory effort, no problems with respiration noted  Abdomen: Soft, gravid, appropriate for gestational age.  Pain/Pressure: Present     Pelvic: Cervical exam deferred        Extremities: Normal range of motion.  Edema: None  Mental Status: Normal mood and affect. Normal behavior. Normal judgment and thought content.   Assessment and Plan:  Pregnancy: G2P1001 at [redacted]w[redacted]d 1. Supervision of other normal pregnancy, antepartum Patient is doing well without complaints New HTN today without symptoms. Patient with elevated BP on 4/22 MAU visit meeting criteria for GHTN. Patient sent to MAU for evaluation and likely IOL  2. Multigravida of advanced maternal age in third trimester   3. Language barrier Interpreter used  4. Carpal tunnel syndrome during  pregnancy   Term labor symptoms and general obstetric precautions including but not limited to vaginal bleeding, contractions, leaking of fluid and fetal movement were reviewed in detail with the patient. Please refer to After Visit Summary for other counseling recommendations.   No follow-ups on file.  No future appointments.  Catalina Antigua, MD

## 2020-06-10 NOTE — Progress Notes (Signed)
+   Fetal movement. Pt blood pressure is elevated today. Right arm BP 159/97, Left arm 152/99. Pt denies any HA, visual changes, RUQ pain, or floaters.

## 2020-06-11 ENCOUNTER — Inpatient Hospital Stay (HOSPITAL_COMMUNITY): Payer: Medicaid Other | Admitting: Anesthesiology

## 2020-06-11 DIAGNOSIS — O1403 Mild to moderate pre-eclampsia, third trimester: Secondary | ICD-10-CM | POA: Diagnosis present

## 2020-06-11 LAB — CBC
HCT: 39.4 % (ref 36.0–46.0)
Hemoglobin: 13.4 g/dL (ref 12.0–15.0)
MCH: 33.2 pg (ref 26.0–34.0)
MCHC: 34 g/dL (ref 30.0–36.0)
MCV: 97.5 fL (ref 80.0–100.0)
Platelets: 232 10*3/uL (ref 150–400)
RBC: 4.04 MIL/uL (ref 3.87–5.11)
RDW: 13 % (ref 11.5–15.5)
WBC: 12.2 10*3/uL — ABNORMAL HIGH (ref 4.0–10.5)
nRBC: 0 % (ref 0.0–0.2)

## 2020-06-11 LAB — RPR: RPR Ser Ql: NONREACTIVE

## 2020-06-11 MED ORDER — MISOPROSTOL 50MCG HALF TABLET
ORAL_TABLET | ORAL | Status: AC
  Filled 2020-06-11: qty 1

## 2020-06-11 MED ORDER — EPHEDRINE 5 MG/ML INJ: 10.0000 mg | INTRAVENOUS | Status: DC | PRN

## 2020-06-11 MED ORDER — MISOPROSTOL 50MCG HALF TABLET
50.0000 ug | ORAL_TABLET | Freq: Once | ORAL | Status: AC
  Administered 2020-06-11: 50 ug via BUCCAL
  Filled 2020-06-11: qty 1

## 2020-06-11 MED ORDER — DIPHENHYDRAMINE HCL 50 MG/ML IJ SOLN: 12.5000 mg | INTRAMUSCULAR | Status: DC | PRN

## 2020-06-11 MED ORDER — TERBUTALINE SULFATE 1 MG/ML IJ SOLN: 0.2500 mg | Freq: Once | INTRAMUSCULAR | Status: DC | PRN

## 2020-06-11 MED ORDER — FENTANYL-BUPIVACAINE-NACL 0.5-0.125-0.9 MG/250ML-% EP SOLN
12.0000 mL/h | EPIDURAL | Status: DC | PRN
  Administered 2020-06-11: 12 mL/h via EPIDURAL
  Filled 2020-06-11: qty 250

## 2020-06-11 MED ORDER — LIDOCAINE HCL (PF) 1 % IJ SOLN
INTRAMUSCULAR | Status: DC | PRN
  Administered 2020-06-11: 11 mL via EPIDURAL

## 2020-06-11 MED ORDER — OXYTOCIN-SODIUM CHLORIDE 30-0.9 UT/500ML-% IV SOLN
1.0000 m[IU]/min | INTRAVENOUS | Status: DC
  Administered 2020-06-11: 2 m[IU]/min via INTRAVENOUS

## 2020-06-11 MED ORDER — PHENYLEPHRINE 40 MCG/ML (10ML) SYRINGE FOR IV PUSH (FOR BLOOD PRESSURE SUPPORT): 80.0000 ug | PREFILLED_SYRINGE | INTRAVENOUS | Status: DC | PRN

## 2020-06-11 MED ORDER — LACTATED RINGERS IV SOLN
500.0000 mL | Freq: Once | INTRAVENOUS | Status: AC
  Administered 2020-06-11: 500 mL via INTRAVENOUS

## 2020-06-11 NOTE — Progress Notes (Signed)
Patient ID: Deborah Davidson, female   DOB: [birth year], 40 y.o.   MRN: [id number]  Pushing approx 35 mins now with min-mod fetal movement  BP 151/84, 133/69 FHR 130-140s, +accels, mi variables Ctx q 4-5 mins with Pit @ 45mu/min Vtx +2  IUP@37 .1wks  Pre-e w/o SF Pushing  Continue to push w ctx; [place] interpreter for coaching tips Anticipate vag del  [name] [facility] 06/11/2020 9:38 PM

## 2020-06-11 NOTE — Anesthesia Procedure Notes (Signed)
Epidural Patient location during procedure: OB Start time: 06/11/2020 7:35 AM End time: 06/11/2020 7:50 AM  Staffing Anesthesiologist: Lowella Curb, MD Performed: anesthesiologist   Preanesthetic Checklist Completed: patient identified, IV checked, site marked, risks and benefits discussed, surgical consent, monitors and equipment checked, pre-op evaluation and timeout performed  Epidural Patient position: sitting Prep: ChloraPrep Patient monitoring: heart rate, cardiac monitor, continuous pulse ox and blood pressure Approach: midline Injection technique: LOR air  Needle:  Needle type: Tuohy  Needle gauge: 17 G Needle length: 9 cm Needle insertion depth: 5 cm Catheter type: closed end flexible Catheter size: 20 Guage Catheter at skin depth: 9 cm Test dose: negative  Assessment Events: blood not aspirated, injection not painful, no injection resistance, no paresthesia and negative IV test  Additional Notes Reason for block:procedure for pain

## 2020-06-11 NOTE — Progress Notes (Signed)
Deborah Davidson is a 40 y.o. G2P1001 at [redacted]w[redacted]d admitted for IOL for pre-eclampsia without severe features.  Subjective: Patient is doing well this morning. She is resting comfortably with her epidural. She denies HA, vision changes or other toxic symptoms.  Objective: BP 138/86   Pulse 71   Temp (!) 97 F (36.1 C) (Axillary)   Resp 18   Ht 5' 4.17" (1.63 m)   Wt 72.4 kg   LMP 09/25/2019   SpO2 99%   BMI 27.26 kg/m  No intake/output data recorded. No intake/output data recorded.  FHT: 125bpm, moderate variability, + accels, + early decels   UC:   Q 1.5-3 mins SVE:   Dilation: 4 Effacement (%): 80 Station: -3 Exam by:: k fields, rn  Labs: Lab Results  Component Value Date   WBC 12.2 (H) 06/11/2020   HGB 13.4 06/11/2020   HCT 39.4 06/11/2020   MCV 97.5 06/11/2020   PLT 232 06/11/2020    Assessment / Plan: Deborah Davidson 40 y.o. G2P1001 at [redacted]w[redacted]d  Labor: s/p cytotec and foley balloon. Pitocin started Preeclampsia:  not currently on mag sulfate, no toxic symptoms, labs stable, continue to monitor Fetal Wellbeing:  Category I Pain Control:  Epidural I/D:  GBS negative Anticipated MOD:  NSVD  Brand Males, MSN, CNM 06/11/2020, 8:37 AM

## 2020-06-11 NOTE — Progress Notes (Signed)
Deborah Davidson is a 40 y.o. G2P1001 at [redacted]w[redacted]d admitted for IOL for pre-eclampsia without severe features  Subjective: Patient is doing well. She has no concerns or questions at this time.   Objective: BP 121/64   Pulse 78   Temp 97.6 F (36.4 C) (Oral)   Resp 18   Ht 5' 4.17" (1.63 m)   Wt 72.4 kg   LMP 09/25/2019   SpO2 99%   BMI 27.26 kg/m  No intake/output data recorded. Total I/O In: -  Out: 700 [Urine:700]  FHT: 125 bpm, moderate variability, +accels, no decels UC: Q 1.5-3 mins SVE:   Dilation: 4.5 Effacement (%): 60,70 Station: -2 Exam by:: dr Lawana Hartzell  Labs: Lab Results  Component Value Date   WBC 12.2 (H) 06/11/2020   HGB 13.4 06/11/2020   HCT 39.4 06/11/2020   MCV 97.5 06/11/2020   PLT 232 06/11/2020    Assessment / Plan: Deborah Davidson 40 y.o. G2P1001 at [redacted]w[redacted]d  Labor: Pitocin currently at 69mu/min. We discussed risks/benefits of AROM and patient gives verbal consent for AROM. AROM with moderate amount of pink tinged fluid. Patient tolerated well.  Preeclampsia: BP's normotensive to mild range, not on mag sulfate, no toxic symptoms, labs remain stable, continue to monitor Fetal Wellbeing:  Category I Pain Control:  Epidural I/D:  GBS negative Anticipated MOD:  NSVD  Brand Males, MSN, CNM 06/11/2020, 3:54 PM

## 2020-06-11 NOTE — Progress Notes (Signed)
Patient ID: Deborah Davidson, female   DOB: 10-12-1980, 40 y.o.   MRN: [id number]  Pushing x 2.5hrs now, has made some progress  BP 143/79, 148/94 FHR 130s, variables w pushing Ctx q 4-5 mins with Pit @ 30 mu/min Vtx +2/+3  IUP@37 .1wks Pre-e w/o SF Pushing  Continue pushing; eval for op vag del in next half hour or so if no delivery  [name] CNM 06/11/2020 11:39 PM

## 2020-06-11 NOTE — Anesthesia Preprocedure Evaluation (Signed)

## 2020-06-11 NOTE — Progress Notes (Signed)
Labor Progress Note Breeze Berringer is a 40 y.o. G2P1001 at [redacted]w[redacted]d presented for IOL for gHTN S: Feeling contractions very rarely   O:  BP (!) 133/93   Pulse 78   Temp 98.5 F (36.9 C) (Oral)   Resp 16   Ht 5' 4.17" (1.63 m)   Wt 72.4 kg   LMP 09/25/2019   BMI 27.26 kg/m  EFM: baseline 130/mod variability/pos accels/no decels   CVE: Dilation: 1.5 Effacement (%): 50 Station: -2 Presentation: Vertex (verified by bedside ultrasound by Dr. Barb Merino) Exam by:: Dr. Barb Merino   A&P: 40 y.o. G2P1001 [redacted]w[redacted]d here for IOL for PEC w/o SF #Labor: s/p cytotec x1, FB placed at 2230, still in place. Will give another dose of cytotec and reassess.   #PEC w/o SF: Based on mild range pressures and UPC .40. PEC labs normal. Asymptomatic. Continues to have MR pressures. Will monitor. #Pain: per patient request #FWB: cat I  #GBS negative    [name], MD 2:49 AM

## 2020-06-11 NOTE — Progress Notes (Addendum)
Deborah Davidson is a 40 y.o. G2P1001 at [redacted]w[redacted]d admitted for IOL for pre-eclampsia without severe features.  Subjective: Patient is doing well. She remains comfortable with her epidural. She continues to deny HA, vision changes or other toxic symptoms. Her husband is present at bedside.  Objective: BP 118/69   Pulse 73   Temp (!) 97 F (36.1 C) (Axillary)   Resp 18   Ht 5' 4.17" (1.63 m)   Wt 72.4 kg   LMP 09/25/2019   SpO2 99%   BMI 27.26 kg/m  No intake/output data recorded. No intake/output data recorded.  FHT: 130bpm, moderate variability, + accels, no decels   UC:   Q 1-5 mins SVE:   Dilation: 4 Effacement (%): 60 Station: -3 Exam by:: k fields, rn  Labs: Lab Results  Component Value Date   WBC 12.2 (H) 06/11/2020   HGB 13.4 06/11/2020   HCT 39.4 06/11/2020   MCV 97.5 06/11/2020   PLT 232 06/11/2020    Assessment / Plan: Deborah Davidson 40 y.o. G2P1001 at [redacted]w[redacted]d  Labor: s/p cytotec and foley balloon. Pitocin currently on 14 mu/min, continue to titrate PRN per unit policy Preeclampsia:  BP's normotensive to mild range, not on mag sulfate, no toxic symptoms, labs remain stable, continue to monitor Fetal Wellbeing:  Category I Pain Control:  Epidural I/D:  GBS negative Anticipated MOD:  NSVD  Brand Males, MSN, CNM 06/11/2020, 12:46 PM

## 2020-06-12 ENCOUNTER — Encounter (HOSPITAL_COMMUNITY): Payer: Self-pay | Admitting: Family Medicine

## 2020-06-12 DIAGNOSIS — Z3A37 37 weeks gestation of pregnancy: Secondary | ICD-10-CM

## 2020-06-12 DIAGNOSIS — O134 Gestational [pregnancy-induced] hypertension without significant proteinuria, complicating childbirth: Secondary | ICD-10-CM

## 2020-06-12 MED ORDER — SENNOSIDES-DOCUSATE SODIUM 8.6-50 MG PO TABS
2.0000 | ORAL_TABLET | ORAL | Status: DC
  Administered 2020-06-12 – 2020-06-14 (×3): 2 via ORAL
  Filled 2020-06-12 (×3): qty 2

## 2020-06-12 MED ORDER — TRANEXAMIC ACID-NACL 1000-0.7 MG/100ML-% IV SOLN: 1000.0000 mg | INTRAVENOUS | Status: AC

## 2020-06-12 MED ORDER — ONDANSETRON HCL 4 MG/2ML IJ SOLN: 4.0000 mg | INTRAMUSCULAR | Status: DC | PRN

## 2020-06-12 MED ORDER — MEASLES, MUMPS & RUBELLA VAC IJ SOLR: 0.5000 mL | Freq: Once | INTRAMUSCULAR | Status: DC

## 2020-06-12 MED ORDER — SIMETHICONE 80 MG PO CHEW: 80.0000 mg | CHEWABLE_TABLET | ORAL | Status: DC | PRN

## 2020-06-12 MED ORDER — IBUPROFEN 600 MG PO TABS
600.0000 mg | ORAL_TABLET | Freq: Four times a day (QID) | ORAL | Status: DC
  Administered 2020-06-12 – 2020-06-14 (×9): 600 mg via ORAL
  Filled 2020-06-12 (×9): qty 1

## 2020-06-12 MED ORDER — PRENATAL MULTIVITAMIN CH
1.0000 | ORAL_TABLET | Freq: Every day | ORAL | Status: DC
  Administered 2020-06-12 – 2020-06-14 (×3): 1 via ORAL
  Filled 2020-06-12 (×3): qty 1

## 2020-06-12 MED ORDER — TRANEXAMIC ACID-NACL 1000-0.7 MG/100ML-% IV SOLN
INTRAVENOUS | Status: AC
  Filled 2020-06-12: qty 100

## 2020-06-12 MED ORDER — TRANEXAMIC ACID-NACL 1000-0.7 MG/100ML-% IV SOLN
1000.0000 mg | Freq: Once | INTRAVENOUS | Status: AC | PRN
  Administered 2020-06-12: 1000 mg via INTRAVENOUS

## 2020-06-12 MED ORDER — WITCH HAZEL-GLYCERIN EX PADS: 1.0000 "application " | MEDICATED_PAD | CUTANEOUS | Status: DC | PRN

## 2020-06-12 MED ORDER — COCONUT OIL OIL: 1.0000 "application " | TOPICAL_OIL | Status: DC | PRN

## 2020-06-12 MED ORDER — OXYCODONE HCL 5 MG PO TABS: 5.0000 mg | ORAL_TABLET | ORAL | Status: DC | PRN

## 2020-06-12 MED ORDER — ONDANSETRON HCL 4 MG PO TABS: 4.0000 mg | ORAL_TABLET | ORAL | Status: DC | PRN

## 2020-06-12 MED ORDER — DIPHENHYDRAMINE HCL 25 MG PO CAPS: 25.0000 mg | ORAL_CAPSULE | Freq: Four times a day (QID) | ORAL | Status: DC | PRN

## 2020-06-12 MED ORDER — ZOLPIDEM TARTRATE 5 MG PO TABS: 5.0000 mg | ORAL_TABLET | Freq: Every evening | ORAL | Status: DC | PRN

## 2020-06-12 MED ORDER — TETANUS-DIPHTH-ACELL PERTUSSIS 5-2.5-18.5 LF-MCG/0.5 IM SUSY
0.5000 mL | PREFILLED_SYRINGE | Freq: Once | INTRAMUSCULAR | Status: AC
  Administered 2020-06-14: 0.5 mL via INTRAMUSCULAR
  Filled 2020-06-12: qty 0.5

## 2020-06-12 MED ORDER — ACETAMINOPHEN 325 MG PO TABS
650.0000 mg | ORAL_TABLET | ORAL | Status: DC | PRN
  Administered 2020-06-13: 650 mg via ORAL
  Filled 2020-06-12: qty 2

## 2020-06-12 MED ORDER — DIBUCAINE (PERIANAL) 1 % EX OINT: 1.0000 "application " | TOPICAL_OINTMENT | CUTANEOUS | Status: DC | PRN

## 2020-06-12 MED ORDER — BENZOCAINE-MENTHOL 20-0.5 % EX AERO
1.0000 "application " | INHALATION_SPRAY | CUTANEOUS | Status: DC | PRN
  Administered 2020-06-13: 1 via TOPICAL
  Filled 2020-06-12: qty 56

## 2020-06-12 NOTE — Lactation Note (Signed)
This note was copied from a baby's chart. Lactation Consultation Note  Patient Name: Deborah Davidson DTOIZ'T Date: 06/12/2020 Reason for consult: Initial assessment;Early term 37-38.6wks Age:38 hours  Consult was done with vietnamese interpreter Deborah Davidson [phone]:  Initial visit to 11 hours infant of a P2. Mother breastfed for 22 months. Mother's feeding goal is to breast and formula feed this infant. Infant is taking ~10-16 mL per feeding. Mother does not have a breast pump. She is a Sports coach. FOI is present and engaged.   Reviewed breastfeeding basics. Reviewed NBN behavior and first days expectations with mother, belly size and expected I&O's.  Feeding plan:  1-Skin to skin 2-Aim for a deep, comfortable latch 3-Breastfeeding on demand or 8-12 times in 24h period. 4-Keep infant awake during breastfeeding session: massaging breast, infant's hand/shoulder/feet 5-Supplement following guidelines, paced bottle feeding and fullness cues.  6-Monitor voids and stools as signs good intake.  7-Encouraged maternal rest, hydration and food intake.  8-Contact LC as needed for feeds/support/concerns/questions  All questions answered at this time. Promoted [employer] booklet and provided LC brochure.   Maternal Data Has patient been taught Hand Expression?: Yes Does the patient have breastfeeding experience prior to this delivery?: Yes How long did the patient breastfeed?: 22 months  Feeding Mother's Current Feeding Choice: Breast Milk and Formula  Interventions Interventions: Breast feeding basics reviewed;Skin to skin;Breast massage;Hand express;Expressed milk;Education  Discharge WIC Program: Yes  Consult Status Consult Status: Follow-up Date: 06/13/20 Follow-up type: In-patient    Aaliayah Miao A Higuera Ancidey 06/12/2020, 1:34 PM

## 2020-06-12 NOTE — Progress Notes (Signed)
Patient ID: Deborah Davidson, female   DOB: April 28, 1980, 40 y.o.   MRN: [id number]  Pushing for over 3 hours, has been in second stage since 1900 06/11/20.  Fetal station is 3+ as per CNM, desires evaluation for operative vaginal delivery.  Patient is also exhausted.  [place] [employer] interpreter used for this encounter.  Blood pressure 132/81, pulse 77, temperature 98.1 F (36.7 C), temperature source Oral, resp. rate 18, height 5' 4.17" (1.63 m), weight 72.4 kg, last menstrual period 09/25/2019, SpO2 99 %. FHR 140s, + mod variability, +accels, +variables decels Ctx q 4-5 mins with Pit @ 30 mu/min Vtx +3/+4 caput present. Edematous vulva noted.  IUP@37 .2wks Pre-e w/o SF Vacuum assistance recommended. Risks of vacuum assistance were discussed in detail, including but not limited to, bleeding, infection, damage to maternal tissues, fetal cephalohematoma, inability to effect vaginal delivery of the head or shoulder dystocia that cannot be resolved by established maneuvers and need for emergency cesarean section.  Patient gave verbal consent. Will proceed when team ready.   Jaynie Collins, MD, FACOG Obstetrician & Gynecologist, [facility] for [employer], [facility]

## 2020-06-12 NOTE — Anesthesia Postprocedure Evaluation (Signed)
Anesthesia Post Note  Patient: Christean Leaf  Procedure(s) Performed: AN AD HOC LABOR EPIDURAL     Patient location during evaluation: Mother Baby Anesthesia Type: Epidural Level of consciousness: awake and alert Pain management: pain level controlled Vital Signs Assessment: post-procedure vital signs reviewed and stable Respiratory status: spontaneous breathing, nonlabored ventilation and respiratory function stable Cardiovascular status: stable Postop Assessment: no headache, no backache, epidural receding, no apparent nausea or vomiting, patient able to bend at knees, adequate PO intake and able to ambulate Anesthetic complications: no   No complications documented.  Last Vitals:  Vitals:   06/12/20 1228 06/12/20 1658  BP: (!) 123/93 117/74  Pulse: 71 70  Resp: 18 18  Temp: (!) 36.4 C 36.7 C  SpO2: 97%     Last Pain:  Vitals:   06/12/20 1658  TempSrc: Oral  PainSc: 0-No pain   Pain Goal:                   [place]

## 2020-06-12 NOTE — Discharge Summary (Signed)
Postpartum Discharge Summary  Patient Name: Deborah Davidson DOB: 1980/07/29 MRN: [id number]  Date of admission: 06/10/2020 Delivery date:06/12/2020  Delivering provider: Janet Berlin  Date of discharge: 06/14/2020  Admitting diagnosis: Supervision of high risk pregnancy, antepartum [O09.90] Intrauterine pregnancy: [redacted]w[redacted]d    Secondary diagnosis:  Active Problems:   AMA (advanced maternal age) multigravida 35+   Language barrier   Antepartum mild preeclampsia, third trimester   Vaginal delivery  Additional problems: as noted above  Discharge diagnosis: Term Pregnancy Delivered and Preeclampsia (mild)                                              Post partum procedures:none Augmentation: AROM, Pitocin, Cytotec and IP Foley Complications: None  Hospital course: Induction of Labor With Vaginal Delivery   40y.o. yo G2P1001 at 364w2das admitted to the hospital 06/10/2020 for induction of labor after her office visit where she had new onset of elevated BPs. Once on L&D, her P/C ratio was elevated at 0.17m57mgiving a dx of pre-e without severe features. Indication for induction: pre-e without severe features Patient had a labor course remarkable for becoming complete and pushing approx 2.5-3hrs prior to maternal exhaustion with rec for a vacuum extraction recommended (see Delivery Note). Membrane Rupture Time/Date: 3:48 PM ,06/11/2020   Delivery Method:Vaginal, Vacuum (Extractor)  Episiotomy:  none Lacerations:  1st degree  Details of delivery can be found in separate delivery note.  Patient had a postpartum course remarkable for normal BPs. Patient is discharged home 06/14/20.  Newborn Data: Birth date:06/12/2020  Birth time:1:47 AM  Gender:Female  Living status:Living  Apgars:1 ,6  Weight:2510 g (5lb 8.5oz)  Magnesium Sulfate received: No BMZ received: No Rhophylac:N/A MMR:N/A T-DaP:ordered postpartum Flu: No Transfusion:No  Physical exam  Vitals:   06/12/20 2037 06/13/20 1500  06/13/20 2222 06/14/20 0546  BP: 117/80 124/86 123/78 129/87  Pulse: 72 79 73 68  Resp: '16 18 18 18  ' Temp: 98 F (36.7 C) 98.3 F (36.8 C) 98.1 F (36.7 C) 97.8 F (36.6 C)  TempSrc: Oral Oral Oral Oral  SpO2: 99% 100%    Weight:      Height:       General: alert, cooperative and no distress Lochia: appropriate Uterine Fundus: firm Incision: N/A DVT Evaluation: No evidence of DVT seen on physical exam. No cords or calf tenderness. No significant calf/ankle edema. Labs: Lab Results  Component Value Date   WBC 12.2 (H) 06/11/2020   HGB 13.4 06/11/2020   HCT 39.4 06/11/2020   MCV 97.5 06/11/2020   PLT 232 06/11/2020   CMP Latest Ref Rng & Units 06/10/2020  Glucose 70 - 99 mg/dL 117(H)  BUN 6 - 20 mg/dL 9  Creatinine 0.44 - 1.00 mg/dL 0.53  Sodium 135 - 145 mmol/L 134(L)  Potassium 3.5 - 5.1 mmol/L 3.8  Chloride 98 - 111 mmol/L 104  CO2 22 - 32 mmol/L 21(L)  Calcium 8.9 - 10.3 mg/dL 9.0  Total Protein 6.5 - 8.1 g/dL 6.4(L)  Total Bilirubin 0.3 - 1.2 mg/dL 0.4  Alkaline Phos 38 - 126 U/L 73  AST 15 - 41 U/L 20  ALT 0 - 44 U/L 12   Edinburgh Score: Edinburgh Postnatal Depression Scale Screening Tool 06/12/2020  I have been able to laugh and see the funny side of things. 0  I have looked forward  with enjoyment to things. 0  I have blamed myself unnecessarily when things went wrong. 0  I have been anxious or worried for no good reason. 0  I have felt scared or panicky for no good reason. 0  Things have been getting on top of me. 0  I have been so unhappy that I have had difficulty sleeping. 0  I have felt sad or miserable. 0  I have been so unhappy that I have been crying. 0  The thought of harming myself has occurred to me. 0  Edinburgh Postnatal Depression Scale Total 0     After visit meds:  Allergies as of 06/14/2020      Reactions   Milk-related Compounds       Medication List    STOP taking these medications   aspirin EC 81 MG tablet     TAKE these  medications   acetaminophen 325 MG tablet Commonly known as: Tylenol Take 2 tablets (650 mg total) by mouth every 6 (six) hours as needed for mild pain, moderate pain, fever or headache (for pain scale < 4).   coconut oil Oil Apply 1 application topically as needed (nipple pain).   ibuprofen 600 MG tablet Commonly known as: ADVIL Take 1 tablet (600 mg total) by mouth every 6 (six) hours.   M-Natal Plus 27-1 MG Tabs Take 1 tablet by mouth daily.        Discharge home in stable condition Infant Feeding: bottle and breast Infant Disposition:home with mother Discharge instruction: per After Visit Summary and Postpartum booklet. Activity: Advance as tolerated. Pelvic rest for 6 weeks.  Diet: routine diet Future Appointments: Future Appointments  Date Time Provider [place]  06/18/2020  1:20 PM [place] None  07/11/2020  8:30 AM Gavin Pound, CNM [facility] None   Follow up Visit:  [place] Follow up on 06/18/2020.   Why: For a blood pressure check Contact information: [address] [place] [address] [phone]              Myrtis Ser, CNM  P [facility] Please schedule this patient for Postpartum visit in: 4 weeks with the following provider: Any provider  In-Person  For C/S patients schedule nurse incision check in weeks 2 weeks: no  High risk pregnancy complicated by: pre-e w/o SF  Delivery mode: Vacuum  Anticipated Birth Control: Condoms  PP Procedures needed: BP check in 1wk  Schedule Integrated [place] visit: no  Wende Mott, CNM OB Fellow, [facility] 06/14/2020 10:38 AM

## 2020-06-13 NOTE — Progress Notes (Signed)
Using [employer] interpreter, Circumcision Consent  Discussed with mom at bedside about circumcision.   Circumcision is a surgery that removes the skin that covers the tip of the penis, called the "foreskin." Circumcision is usually done when a boy is between 13 and 36 days old, sometimes up to 30-54 weeks old.  The most common reasons boys are circumcised include for cultural/religious beliefs or for parental preference (potentially easier to clean, so baby looks like daddy, etc).  There may be some medical benefits for circumcision:   Circumcised boys seem to have slightly lower rates of: ? Urinary tract infections (per the American Academy of Pediatrics an uncircumcised boy has a 1/100 chance of developing a UTI in the first year of life, a circumcised boy at a 02/998 chance of developing a UTI in the first year of life- a 10% reduction) ? Penis cancer (typically rare- an uncircumcised female has a 1 in 100,000 chance of developing cancer of the penis) ? Sexually transmitted infection (in endemic areas, including HIV, HPV and Herpes- circumcision does NOT protect against gonorrhea, chlamydia, trachomatis, or syphilis) ? Phimosis: a condition where that makes retraction of the foreskin over the glans impossible (0.4 per 1000 boys per year or 0.6% of boys are affected by their 15th birthday)  Boys and men who are not circumcised can reduce these extra risks by: ? Cleaning their penis well ? Using condoms during sex  What are the risks of circumcision?  As with any surgical procedure, there are risks and complications. In circumcision, complications are rare and usually minor, the most common being: ? Bleeding- risk is reduced by holding each clamp for 30 seconds prior to a cut being made, and by holding pressure after the procedure is done ? Infection- the penis is cleaned prior to the procedure, and the procedure is done under sterile technique ? Damage to the urethra or amputation of the  penis  How is circumcision done in baby boys?  The baby will be placed on a special table and the legs restrained for their safety. Numbing medication is injected into the penis, and the skin is cleansed with betadine to decrease the risk of infection.   What to expect:  The penis will look red and raw for 5-7 days as it heals. We expect scabbing around where the cut was made, as well as clear-pink fluid and some swelling of the penis right after the procedure. If your baby's circumcision starts to bleed or develops pus, please contact your pediatrician immediately.  All questions were answered and mother consented.  Jacklyn Shell Obstetrics Fellow

## 2020-06-13 NOTE — Lactation Note (Addendum)
This note was copied from a baby's chart. Lactation Consultation Note  Patient Name: Deborah Davidson Date: 06/13/2020 Reason for consult: Follow-up assessment;Mother's request;Early term 37-38.6wks;Infant < 6lbs (GHTN) Age:38 hours   LC talked with Mom help of translator for Deborah Davidson # [id number]. Mom also provided with [facility] phone number for [facility]. Mom does not have a pump at home, probably require a pump loaner on discharge.  Mom decided to bottle feed formula while in the hospital and will breastfeed at home. LC talked with RN, Candace Cruise, to see if Mom wanted to start pumping on dEBP to maintain her milk supply. Mom stated yes, LC set her up on DEBP sized her flange at 24 which she stated felt comfortable.   LC reviewed LPTI guidelines to reduce calorie loss. LC also went over Mom offering EBM first in separate bottle the formula following formula feeding guidelines provided.  Mom offering 20-30 ml with 22 calorie formula. Infant multiple stool last urine 4 am. LC to review with Mom color change of diaper to ensure not missing a urine.  All questions answered at the end of the visit.   Plan above discussed with RN, Candace Cruise.  Maternal Data    Feeding Mother's Current Feeding Choice: Breast Milk and Formula  LATCH Score                    Lactation Tools Discussed/Used Tools: Pump;Flanges Flange Size: 24 Breast pump type: Double-Electric Breast Pump Pump Education: Setup, frequency, and cleaning;Milk Storage Reason for Pumping: increase stimulation Pumping frequency: every 3 hrs for 15 minutes  Interventions Interventions: Breast feeding basics reviewed;Education;DEBP  Discharge Pump: Manual WIC Program: Yes  Consult Status Consult Status: Follow-up Date: 06/14/20 Follow-up type: In-patient    Deborah Davidson  Deborah Davidson 06/13/2020, 4:48 PM

## 2020-06-13 NOTE — Progress Notes (Addendum)
POSTPARTUM PROGRESS NOTE  Subjective: Deborah Davidson is a 40 y.o. G2P2002 s/p VAVD at [redacted]w[redacted]d  She reports she doing well. No acute events overnight. She denies any problems with ambulating, voiding or po intake. Denies nausea or vomiting. She has passed flatus. Pain is well controlled.  Lochia is minimal.  Objective: Blood pressure 117/80, pulse 72, temperature 98 F (36.7 C), temperature source Oral, resp. rate 16, height 5' 4.17" (1.63 m), weight 72.4 kg, last menstrual period 09/25/2019, SpO2 99 %, unknown if currently breastfeeding.  Physical Exam:  General: alert, cooperative and no distress Chest: no respiratory distress Abdomen: soft, non-tender  Uterine Fundus: firm and at level of umbilicus Extremities: No calf swelling or tenderness  no edema  Recent Labs    06/10/20 1640 06/11/20 0646  HGB 13.7 13.4  HCT 39.6 39.4    Assessment/Plan: HTahja Liaois a 40y.o. G2P2002 s/p VAVD at 328w2dor intermittent latent decels.  Routine Postpartum Care: Doing well, pain well-controlled.  Expresses wanting to stay another day while baby is being observed by Peds team. -- Continue routine care, lactation support  -- Pre-E w/o severe features:  BP WNL -- Contraception: Condoms -- Feeding: Breast and Bottle  Dispo: Plan for discharge 06/14/20.  MaDelora FuelMD 06/13/2020 9:19 AM  I personally saw and evaluated the patient, performing the key elements of the service. I developed and verified the management plan that is described in the resident's/student's note, and I agree with the content with my edits above. VSS, HRR&R, Resp unlabored, Legs neg.  FrNigel BertholdCNM 06/16/2020 6:23 PM

## 2020-06-14 MED ORDER — COCONUT OIL OIL: 1.0000 "application " | TOPICAL_OIL | 0 refills | Status: AC | PRN

## 2020-06-14 MED ORDER — PREPLUS 27-1 MG PO TABS: 1.0000 | ORAL_TABLET | Freq: Every day | ORAL | 13 refills | Status: AC

## 2020-06-14 MED ORDER — ACETAMINOPHEN 325 MG PO TABS: 650.0000 mg | ORAL_TABLET | Freq: Four times a day (QID) | ORAL | Status: AC | PRN

## 2020-06-14 MED ORDER — IBUPROFEN 600 MG PO TABS: 600.0000 mg | ORAL_TABLET | Freq: Four times a day (QID) | ORAL | 0 refills | Status: AC

## 2020-06-14 NOTE — Progress Notes (Signed)
Education/dc reviewed with Ipad interpreter

## 2020-06-14 NOTE — Lactation Note (Signed)
This note was copied from a baby's chart. Lactation Consultation Note  Patient Name: Deborah Davidson FIEPP'I Date: 06/14/2020 Reason for consult: Follow-up assessment;Early term 37-38.6wks;Hyperbilirubinemia Age:38 hours   0823 - 0847 - Interpretor: Iantha Fallen #[id number]  My visit overlapped with the pediatrician's consult. He discussed the baby's progress in terms of blood sugar level and bilirubin level. He recommended that baby stay an additional day for monitoring and provided education regarding bilirubin and next steps.  After pediatrician left, I reviewed Deborah Davidson's feeding plan. She states that she exclusively breast fed her first child, and this is the first time she has used formula. She reports hat she is breast feeding baby, but her milk is "not in" at this time. We discussed that at 55 hours postpartum, the likelihood that her milk will transition this evening or tomorrow. Her goal is to breast feed.  Deborah Davidson DEBP was set up  In the room. She reports that she's pumping every 2-3 hours.  I discussed her feeding plan with the inclusion of the need to supplement baby. She is giving 40 mls with each feeding for day three.  The plan we reviewed is as follows: - Breast feed first. Limit breast feeding time today to about 15-30 minutes and then supplement.  - Following supplementation, she can do STS and allow baby to latch if baby cues. - Supplement, approximately 40 mls. This volume can be modified as determined by baby's progress. - Feed baby every 2-3 hours today (frequency can be determined by baby's cues, and Deborah Davidson is encouraged to wake baby to feed, as needed).  - Continue to post pump every 2-3 hours during the day and every 3-4 hours at night for 15 minute (both breasts).  WIC loaner pump may be indicated upon discharge.   Maternal Data Does the patient have breastfeeding experience prior to this delivery?: Yes  Feeding Mother's Current Feeding Choice: Breast Milk and  Formula Nipple Type: Extra Slow Flow    Lactation Tools Discussed/Used Tools: Pump Pump Education: Setup, frequency, and cleaning Pumping frequency: q2-3 hours Pumped volume: 0 mL  Interventions Interventions: Breast feeding basics reviewed;Education  Discharge    Consult Status Consult Status: Follow-up Date: 06/15/20 Follow-up type: In-patient    Walker Shadow 06/14/2020, 9:19 AM

## 2020-06-14 NOTE — Discharge Instructions (Signed)
Ch?m [place] h?u s?n sau khi sinh th??ng Postpartum Care After Vaginal Delivery Thng tin sau ?y cung c?p h??ng d?n v? cch ch?m [place] b?n thn qu v? t? lc sinh con cho ??n 6-12 tu?n sau sinh (th?i k? h?u s?n). N?u qu v? c cc v?n ?? ho?c c th?c m?c, hy lin h? v?i chuyn gia ch?m [place] s?c kh?e ?? ???c h??ng d?n c? th? h?n. Tun th? nh?ng h??ng d?n ny ? nh: Ch?y mu m ??o  Ch?y mu m ??o (s?n d?ch) sau khi sinh l bnh th??ng. Hy mang b?ng v? sinh ?? th?m mu v d?ch ti?t. ? Trong tu?n ??u tin sau khi sinh, l??ng v mu s?c c?a s?n d?ch th??ng t??ng t? v?i kinh nguy?t. ? Trong vi tu?n ti?p theo, n s? gi?m d?n thnh d?ch ti?t kh, mu vng nu. ? ??i v?i h?u h?t ph? n?, s?n d?ch ng?ng hon ton sau sinh 4-6 tu?n, nh?ng c th? thay ??i.  Thay b?ng v? sinh th??ng xuyn. Theo di b?t c? thay ??i no v? vi?c ra mu, ch?ng h?n nh?: ? ??t ng?t t?ng l??ng mu ch?y ra. ? Thay ??i mu s?c. ? C?c mu ?ng l?n.  N?u quy? vi? ra m?t cu?c ma?u ?ng ? m ??o, ha?y gi?? n la?i v g?i cho chuyn gia ch?m so?c s??c kho?e. Khng xa? tri ca?c cu?c ma?u ?ng xu?ng b?n c?u ma? ch?a ni v?i chuyn gia ch?m so?c s??c kho?e.  Khng s?? du?ng nu?t g?c ho??c thu?t r??a cho ??n khi ???c chuyn gia ch?m so?c s??c kho?e ch?p thu?n.  N?u qu v? khng nui con b?ng s?a m?, qu v? s? c kinh nguy?t tr? l?i trong vng 6-8 tu?n sau khi sinh con. N?u qu v? ch? cho con b s?a m?, kinh nguy?t c th? khng c tr? l?i cho ??n khi qu v? ng?ng cho con b. Ch?m [place] vng ?y ch?u  Gi? cho vng gi?a m ??o v h?u mn (?y ch?u) s?ch v kh. S? d?ng b?ng t?m thu?c v thu?c x?t v kem c thu?c gi?m ?au theo ch? d?n.  N?u qu v? c v?t c?t ph?u thu?t ? ?y ch?u (c?t t?ng sinh mn) ho?c v?t rch, hy ki?m tra khu v?c ? xem c d?u hi?u nhi?m trng khng cho ??n khi qu v? lnh l?i. Ki?m tra xem c: ? ??, s?ng, ho?c ?au nhi?u h?n. ? D?ch ho?c mu ch?y ? v?t c?t ho?c v?t rch. ? ?m. ? M? ho?c mi hi.  Qu v? c th?  ???c cung c?p m?t bnh phun ?? s? d?ng thay v lau ?? lm s?ch vng ?y ch?u sau khi s? d?ng nh v? sinh. Th?m nh? nhng ?? lm kh vng ny.  ?? gi?m ?au do th? thu?t c?t t?ng sinh mn, v?t rch, ho?c s?ng t?nh m?ch trong h?u mn (b?nh tr?), hy ngm mng trong n??c ?m 2-3 l?n m?i ngy. Trong khi ngm mng, n??c ?m ch? c?n ng?p ??n hng v ng?p mng qu v?.   Ch?m [place] v  Trong vi ngy ??u tin sau sinh, v c?a qu v? c th? c?m gic n?ng, c?ng v c?m gic kh ch?u (c??ng s?a). S?a ? v c?ng c th? r? ra. Hy h?i chuyn gia ch?m [place] s?c kh?e v? cch gip gi?m c?m gic kh ch?u.  N?u quy? vi? ?ang cho con b: ? M??c a?o ng??c h? tr?? cho vu? va? v??a v??n v??i quy? vi?. S? d?ng mi?ng ??m o ng?c ?? th?m s?a  r? ra. ? Gi? cho nm v c?a qu v? s?ch v kh. Bi kem v thu?c m? theo ch? d?n. ? Qu v? c th? c cc c?n co t? cung m?i l?n cho con b cho ??n vi tu?n sau sinh. ?i?u ny gip t? cung c?a qu v? tr? l?i kch th??c bnh th??ng. ? N?u qu v? c b?t k? v?n ?? no v?i vi?c cho con b, hy thng bo cho chuyn gia ch?m [place] s?c kh?e ho?c chuyn gia t? v?n nui con b?ng s?a m?.  N?u qu v? khng cho con b: ? Trnh ch?m vo v. Khng v?t (n?n) s?a. Lm nh? v?y c th? khi?n v s?n xu?t nhi?u s?a h?n. ? M?c o ng?c v?a v?n v s? d?ng ti ch??m l?nh ?? gip gi?m s?ng. Quan h? thn m?t v tnh d?c  H?i chuyn gia ch?m so?c s??c kho?e cu?a quy? vi? v? vi?c khi na?o quy? vi? co? th? quan h? ti?nh du?c. ?i?u na?y c th? phu? thu?c va?o: ? Nguy c? nhi?m tru?ng c?a qu v?. ? T?c ?? lnh l?i c?a qu v?. ? C?m gic thoa?i ma?i va? ham mu?n quan h? ti?nh du?c c?a qu v?.  Qu v? c th? c [place] sau khi sinh con, ngay c? khi qu v? ch?a th?y kinh nguy?t. Hy trao ??i v?i chuyn gia ch?m [place] s?c kh?e v? cc ph??ng php ng?a thai (trnh [place]) ho?c k? ho?ch ha gia ?nh n?u qu v? mu?n mang thai sau ny. Thu?c  Ch? s? d?ng thu?c khng k ??n v thu?c k ??n theo ch? d?n c?a chuyn gia ch?m [place]  s?c kh?e.  Ch? s? d?ng thu?c lm m?m phn khng k ??n ?? gip d? dng ?i ??i ti?n theo ch? d?n c?a chuyn gia ch?m [place] s?c kh?e.  N?u qu v? ???c k ??n thu?c khng sinh, hy dng thu?c theo ch? d?n c?a chuyn gia ch?m [place] s?c kh?e. Khng d?ng s? d?ng thu?c khng sinh ngay c? khi qu v? b?t ??u c?m th?y ?? h?n.  Xem l?i t?t c? cc ??n thu?c tr??c ?y v hi?n t?i ?? ki?m tra kh? n?ng thu?c truy?n vo s?a m?. Ho?t ??ng  D?n tr? l?i sinh ho?t bnh th??ng theo ch? d?n c?a chuyn gia ch?m [place] s?c kh?e.  Ngh? ng?i cng nhi?u cng t?t. Ch?p m?t khi con qu v? ng?. ?n v u?ng  U?ng ?? n??c ?? gi? cho n??c ti?u c mu vng nh?t.  ?? gip ng?n ng?a ho?c gi?m to bn, hy ?n th?c ph?m ch?a nhi?u ch?t x? m?i ngy.  Ch?n ch? ?? ?n lnh m?nh ?? h? tr? cho con b ho?c m?c tiu gi?m cn.  U?ng vitamin tr??c khi sinh cho ??n khi chuyn gia ch?m [place] s?c kh?e ni qu v? d?ng l?i.   L?i khuyn/khuy?n ngh? chung  Khng s? d?ng b?t k? s?n ph?m no c nicotine ho?c thu?c l. Nh?ng s?n ph?m ny bao g?m thu?c l d?ng ht, thu?c l d?ng nhai v d?ng c? ht thu?c, ch?ng h?n nh? thu?c l ?i?n t?. N?u qu v? c?n gip ?? ?? cai thu?c, hy h?i chuyn gia ch?m [place] s?c kh?e.  Khng u?ng bia r??u, ??c bi?t l n?u qu v? ?ang cho con b.  Khng dng thu?c ho?c thu?c khng ???c k ??n cho qu v?, ??c bi?t l n?u qu v? ?ang cho con b.  G?p chuyn gia ch?m [place] s?c kh?e ?? khm s?c kh?e h?u s?n trong vng 3-6 tu?n ??u  sau khi sinh.  Hon thnh l?n khm sau sinh ton di?n khng mu?n h?n 12 tu?n sau khi sinh.  Tun th? t?t c? cc l?n khm theo di cho qu v? v con qu v?. Hy lin l?c v?i chuyn gia ch?m [place] s?c kh?e n?u:  Quy? vi? ca?m th?y bu?n ho??c lo l??ng b?t th???ng.  Vu? cu?a quy? vi? b? ??, ?au, ho?c c?ng.  Qu v? b? s?t ho?c cc d?u hi?u khc c?a nhi?m trng.  Qu v? b? ch?y mu lm ??t m?t b?ng v? sinh m?i gi? ho?c qu v? c c?c mu ?ng.  Qu v? b? ?au ??u r?t nhi?u m khng kh?i ho?c qu v? b?  thay ??i th? l?c.  Qu v? bu?n nn v nn i v khng th? ?n ho?c u?ng b?t c? th? g trong 24 gi?[place] c?u tr? gip ngay l?p t?c n?u:  Qu v? b? ?au ng?c ho?c kh th?.  Qu v? b? ?au chn r?t nhi?u, ??t ng?t.  Qu v? ng?t ho?c co gi?t.  Qu v? c  nghi? v? vi?c la?m t?n th??ng ba?n thn ho?c con quy? vi?. N?u qu v? ? t?ng c?m th?y nh? c th? t? lm th??ng t?n b?n thn ho?c ng??i khc, ho?c c suy ngh? v? vi?c t? t?, hy yu c?u tr? gip ngay l?p t?c. ??n phng c?p c?u g?n nh?t ho?c:  G?i cho d?ch v? c?p c?u t?i ??a ph??ng (911 ? [place]?).  National Suicide Prevention Lifeline (???ng Dy Ng?n Ch?n T? T? Qu?c Gia) theo s? [phone]. ???ng dy tr? gip kh?ng ho?ng t? t? ny ho?t ??ng 24 gi? m?t ngy.  G?i tin nh?n t?i Crisis Text Line (???ng dy kh?ng ho?ng) theo s? [phone] (t?i [place]?). Tm t?t  Kho?ng th?i gian sau khi qu v? sinh em b cho ??n 6-12 tu?n sau khi sinh ???c g?i l th?i k? h?u s?n.  Tun th? t?t c? cc l?n khm theo di cho qu v? v con qu v?.  Xem l?i t?t c? cc ??n thu?c tr??c ?y v hi?n t?i ?? ki?m tra kh? n?ng thu?c truy?n vo s?a m?.  Lin h? v?i chuyn gia ch?m [place] s?c kh?e n?u qu v? c?m th?y bu?n ho?c lo l?ng b?t th??ng trong th?i k? h?u s?n. Thng tin ny khng nh?m m?c ?ch thay th? cho l?i khuyn m chuyn gia ch?m [place] s?c kh?e ni v?i qu v?. Hy b?o ??m qu v? ph?i th?o lu?n b?t k? v?n ?? g m qu v? c v?i chuyn gia ch?m  s?c kh?e c?a qu v?. Document Revised: 11/23/2019 Document Reviewed: 11/14/2019 Elsevier Patient Education  2021 Elsevier Inc.   Places to have your son circumcised (for self-pay patients//Medicaid now covers circumcisions):                                                                      [facility]     [phone]   $480 while you are in hospital         [facility]              [phone]   [phone] by 4 wks  [facility]                     [phone]   $269 by 7 days [facility]                    [phone]    $269 by 4 wks [facility]             [phone]   $225 by 2 wks    These prices sometimes change but are roughly what you can expect to pay. Please call and confirm pricing.   There are many reasons parents decide to have their sons circumsized. During the first year of life circumcised males have a reduced risk of urinary tract infections but after this year the rates between circumcised males and uncircumcised males are the same.  It can reduce rate of HIV and other STI infection later in life.  It is safe to have your son circumcised outside of the hospital and the places above perform them regularly.   Deciding about Circumcision in Baby Boys  (Up-to-date The Basics)  What is circumcision?   Circumcision is a surgery that removes the skin that covers the tip of the penis, called the "foreskin" Circumcision is usually done when a boy is between 35 and 88 days old. In the [place], circumcision is common. In some other countries, fewer boys are circumcised. Circumcision is a common tradition in some religions.  Should I have my baby boy circumcised?   There is no easy answer. Circumcision has some benefits. But it also has risks. After talking with your doctor, you will have to decide for yourself what is right for your family.  What are the benefits of circumcision?   Circumcised boys seem to have slightly lower rates of: ?Urinary tract infections ?Swelling of the opening at the tip of the penis Circumcised men seem to have slightly lower rates of: ?Urinary tract infections ?Swelling of the opening at the tip of the penis ?Penis cancer ?HIV and other infections that you catch during sex, or transmit to a future partner(s) ?Cervical cancer in the women they have sex with Even so, in the [place], the risks of these problems are small - even in boys and men who have not been circumcised. Plus, boys and men who are not circumcised can reduce these extra risks by: ?Cleaning their  penis well ?Using condoms during sex  What are the risks of circumcision?  Risks include: ?Bleeding or infection from the surgery ?Damage to or amputation of the penis ?A chance that the doctor will cut off too much or not enough of the foreskin ?A chance that sex won't feel as good later in life Only about 1 out of every 200 circumcisions leads to problems. There is also a chance that your health insurance won't pay for circumcision.  How is circumcision done in baby boys?  First, the baby gets medicine for pain relief. This might be a cream on the skin or a shot into the base of the penis. Next, the doctor cleans the baby's penis well. Then he or she uses special tools to cut off the foreskin. Finally, the doctor wraps a bandage (called gauze) around the baby's penis. If you have your baby circumcised, his doctor or nurse will give you instructions on how to care for him after the surgery. It is important that you follow those instructions carefully.

## 2020-06-15 ENCOUNTER — Ambulatory Visit: Payer: Self-pay

## 2020-06-15 NOTE — Lactation Note (Signed)
This note was copied from a baby's chart. Lactation Consultation Note  Patient Name: Boy Quinlee Sciarra [id number] Date: 06/15/2020 Reason for consult: Follow-up assessment;Difficult latch;Infant < 6lbs;Hyperbilirubinemia;Early term 37-38.6wks Age:38 days   P2 mother whose infant is now 55 hours old.  This is an ETI at 37+2 weeks.  Mother exclusively breast fed her first child for 22 months.  Mother's feeding preference is breast/bottle.  Bilirubin level is currently 14.5 mg/dl at 75 hours of life.    [place] interpreter [phone]) used for interpretation.  Mother is formula feeding only at this time due to baby's bilirubin level and mother states that she "does not have any milk."  Mother is aware of colostrum and the need for using the DEBP consistently every three hours.  Her breasts do not feel fuller yet today.  Discussed continuing to breast feed first as desired followed by formula supplementation at least every three hours.  Suggested 30+ mls/feeding session.  Mother is using Similac 22 calorie formula.  Mother is using the extra slow flow nipple and is not having any difficulty bottle feeding her baby.  She had no questions/concerns related to pumping and denies pain with pumping.    Mother is a [facility] participant in [facility] and does not have a DEBP for home use.  The flowsheets show that a referral was faxed, however, mother has not had any contact from the [facility] department at all.  Faxed another referral and explained that mother will need to speak to the [facility] representative tomorrow prior to discharge.  Mother verbalized understanding.  No support person present at this time.   Maternal Data Has patient been taught Hand Expression?: Yes Does the patient have breastfeeding experience prior to this delivery?: Yes How long did the patient breastfeed?: 22 months  Feeding Mother's Current Feeding Choice: Breast Milk and Formula  LATCH Score                    Lactation  Tools Discussed/Used    Interventions Interventions: Education  Discharge Pump: DEBP;Manual;Personal (Desires a WIC DEBP at discharge) WIC Program: Yes  Consult Status Consult Status: Follow-up Date: 06/16/20 Follow-up type: In-patient    Shaunae Sieloff R Savaya Hakes 06/15/2020, 11:05 AM

## 2020-06-18 ENCOUNTER — Other Ambulatory Visit: Payer: Self-pay | Admitting: Obstetrics and Gynecology

## 2020-06-18 ENCOUNTER — Ambulatory Visit (INDEPENDENT_AMBULATORY_CARE_PROVIDER_SITE_OTHER): Payer: Medicaid Other

## 2020-06-18 ENCOUNTER — Other Ambulatory Visit: Payer: Self-pay

## 2020-06-18 VITALS — BP 138/91 | HR 91

## 2020-06-18 DIAGNOSIS — Z0130 Encounter for examination of blood pressure without abnormal findings: Secondary | ICD-10-CM

## 2020-06-18 MED ORDER — HYDROCHLOROTHIAZIDE 25 MG PO TABS: 25.0000 mg | ORAL_TABLET | Freq: Every day | ORAL | 3 refills | Status: AC

## 2020-06-18 NOTE — Progress Notes (Signed)
Subjective:  Deborah Davidson is a 40 y.o. female here for BP check.   Hypertension ROS: Patient is currently not on medications for her blood pressure. She has no swelling in hands or feet, no  headaches, TIA's or visual concerns at this time.   Objective:  LMP 09/25/2019   Appearance alert, well appearing, and in no distress. General exam:  BP not control in the office today.   Assessment:   Blood Pressure: Blood not control in the office.   Plan: Per Dr.Constant will start patient on HCTZ  25 mg once tablet by mouth daily.  Recheck blood pressure in one week.

## 2020-06-25 ENCOUNTER — Ambulatory Visit (INDEPENDENT_AMBULATORY_CARE_PROVIDER_SITE_OTHER): Payer: Medicaid Other

## 2020-06-25 ENCOUNTER — Other Ambulatory Visit: Payer: Self-pay

## 2020-06-25 DIAGNOSIS — Z0130 Encounter for examination of blood pressure without abnormal findings: Secondary | ICD-10-CM

## 2020-06-25 NOTE — Progress Notes (Signed)
Subjective:  Deborah Davidson is a 40 y.o. female here for BP check s/p Vacuum vaginal delivery on 06/12/20. Pt is taking HCTZ as directed. Last dose was this morning.   Hypertension ROS: taking medications as instructed, no medication side effects noted, no TIA's, no chest pain on exertion, no dyspnea on exertion and no swelling of ankles    Objective:  BP 125/86   Pulse 76   LMP 09/25/2019   Appearance alert, well appearing, and in no distress. General exam BP noted to be well controlled today in office.    Assessment:   Blood Pressure well controlled.   Plan:  Current treatment plan is effective, no change in therapy.  Keep upcoming routine pp visit

## 2020-06-27 NOTE — Progress Notes (Signed)
Patient was assessed and managed by nursing staff during this encounter. I have reviewed the chart and agree with the documentation and plan. I have also made any necessary editorial changes.  Ecru Bing, MD 06/27/2020 11:15 AM

## 2020-07-11 ENCOUNTER — Ambulatory Visit: Payer: Medicaid Other

## 2020-07-17 ENCOUNTER — Encounter: Payer: Self-pay | Admitting: Obstetrics & Gynecology

## 2020-07-17 ENCOUNTER — Other Ambulatory Visit: Payer: Self-pay

## 2020-07-17 ENCOUNTER — Ambulatory Visit (INDEPENDENT_AMBULATORY_CARE_PROVIDER_SITE_OTHER): Payer: Medicaid Other | Admitting: Obstetrics & Gynecology

## 2020-07-17 NOTE — Patient Instructions (Signed)

## 2020-07-17 NOTE — Progress Notes (Signed)
    Post Partum Visit Note  Deborah Davidson is a 40 y.o. G40P2002 female who presents for a postpartum visit. She is 5 weeks postpartum following a normal spontaneous vaginal delivery.  I have fully reviewed the prenatal and intrapartum course. The delivery was at 37 gestational weeks.  Anesthesia: epidural. Postpartum course has been unremarkable. Baby is doing well. Baby is feeding by bottle - Goodstart. Bleeding no bleeding. Bowel function is normal. Bladder function is normal. Patient is not sexually active. Contraception method is none. Postpartum depression screening: negative.   The pregnancy intention screening data noted above was reviewed. Potential methods of contraception were discussed. The patient elected to proceed with No Method - No Contraceptive Precautions.     Health Maintenance Due  Topic Date Due  . COVID-19 Vaccine (1) Never done  . Hepatitis C Screening  Never done  . PAP SMEAR-Modifier  Never done    The following portions of the patient's history were reviewed and updated as appropriate: allergies, current medications, past family history, past medical history, past social history, past surgical history and problem list.  Review of Systems Pertinent items are noted in HPI.  Objective:  LMP 09/25/2019    General:  alert, cooperative and no distress           Abdomen: soft, non-tender; bowel sounds normal; no masses,  no organomegaly      GU exam:  not indicated       Assessment:    There are no diagnoses linked to this encounter.  Normal 5 week postpartum exam.   Plan:   Essential components of care per ACOG recommendations:  1.  Mood and well being: Patient with negative depression screening today. Reviewed local resources for support.  - Patient tobacco use? No.   - hx of drug use? No.    2. Infant care and feeding:  -Patient currently breastmilk feeding? No.  -Social determinants of health (SDOH) reviewed in EPIC. No concerns  3. Sexuality,  contraception and birth spacing - Patient does not know want a pregnancy in the next year.  Desired family size is 2 children.  - Reviewed forms of contraception in tiered fashion. Patient desired no method today.   - Discussed birth spacing of 18 months  4. Sleep and fatigue -Encouraged family/partner/community support of 4 hrs of uninterrupted sleep to help with mood and fatigue  5. Physical Recovery  - Discussed patients delivery and complications. She describes her labor as good. - Patient had a Vaginal, no problems at delivery. Patient had a 1st degree laceration. Perineal healing reviewed. Patient expressed understanding - Patient has urinary incontinence? No. - Patient is safe to resume physical and sexual activity  6.  Health Maintenance - HM due items addressed Yes - Last pap smear No results found for: DIAGPAP Pap smear not done at today's visit.  -Breast Cancer screening indicated? No.   7. Chronic Disease/Pregnancy Condition follow up: None  - PCP follow up Adam Phenix, MD  Center for [facility]

## 2020-09-13 ENCOUNTER — Other Ambulatory Visit: Payer: Self-pay | Admitting: Obstetrics and Gynecology
